# Patient Record
Sex: Female | Born: 1955 | Race: White | Hispanic: No | Marital: Single | Smoking: Never smoker
Health system: Southern US, Community
[De-identification: ages and names within clinical notes are randomized; demographics above are authoritative.]

## PROBLEM LIST (undated history)

## (undated) DIAGNOSIS — E282 Polycystic ovarian syndrome: Secondary | ICD-10-CM

## (undated) DIAGNOSIS — E079 Disorder of thyroid, unspecified: Secondary | ICD-10-CM

## (undated) DIAGNOSIS — K7689 Other specified diseases of liver: Secondary | ICD-10-CM

## (undated) HISTORY — DX: Disorder of thyroid, unspecified: E07.9

## (undated) HISTORY — PX: OTHER SURGICAL HISTORY: SHX169

## (undated) HISTORY — DX: Polycystic ovarian syndrome: E28.2

## (undated) HISTORY — DX: Other specified diseases of liver: K76.89

---

## 2002-02-26 ENCOUNTER — Encounter: Payer: Self-pay | Admitting: Internal Medicine

## 2002-02-26 ENCOUNTER — Ambulatory Visit (HOSPITAL_COMMUNITY): Admission: RE | Admit: 2002-02-26 | Discharge: 2002-02-26 | Payer: Self-pay | Admitting: Cardiology

## 2002-08-15 ENCOUNTER — Encounter: Payer: Self-pay | Admitting: Internal Medicine

## 2002-08-15 ENCOUNTER — Ambulatory Visit (HOSPITAL_COMMUNITY): Admission: RE | Admit: 2002-08-15 | Discharge: 2002-08-15 | Payer: Self-pay | Admitting: Internal Medicine

## 2003-02-10 ENCOUNTER — Ambulatory Visit (HOSPITAL_COMMUNITY): Admission: RE | Admit: 2003-02-10 | Discharge: 2003-02-10 | Payer: Self-pay | Admitting: Internal Medicine

## 2003-02-10 ENCOUNTER — Encounter: Payer: Self-pay | Admitting: Internal Medicine

## 2004-02-13 ENCOUNTER — Ambulatory Visit (HOSPITAL_COMMUNITY): Admission: RE | Admit: 2004-02-13 | Discharge: 2004-02-13 | Payer: Self-pay | Admitting: Internal Medicine

## 2006-02-06 ENCOUNTER — Ambulatory Visit (HOSPITAL_COMMUNITY): Admission: RE | Admit: 2006-02-06 | Discharge: 2006-02-06 | Payer: Self-pay | Admitting: Internal Medicine

## 2008-04-25 ENCOUNTER — Ambulatory Visit: Payer: Self-pay | Admitting: Internal Medicine

## 2008-05-15 ENCOUNTER — Telehealth: Payer: Self-pay | Admitting: Internal Medicine

## 2008-05-16 ENCOUNTER — Ambulatory Visit: Payer: Self-pay | Admitting: Internal Medicine

## 2008-05-16 LAB — HM COLONOSCOPY

## 2008-08-28 ENCOUNTER — Encounter: Payer: Self-pay | Admitting: Internal Medicine

## 2010-05-29 ENCOUNTER — Encounter: Payer: Self-pay | Admitting: Internal Medicine

## 2010-09-02 ENCOUNTER — Encounter: Payer: Self-pay | Admitting: Internal Medicine

## 2010-09-24 NOTE — Letter (Signed)
February 10, 2006     Allena Napoleon, M.D.  [address]  [place], [place] [address]   RE:  Jillian Martinez, Jillian Martinez  MRN:  [id number]  /  DOB:  1955/06/08   Dear Lanora Manis:   I would like to give you follow up on Jillian Martinez's MRI which was done on  February 06, 2006, and was a followup study from October 2005.  As you  remember, she had several liver lesions with one of them focal nodular  hyperplasia in the left lobe of the liver and the other small ones probably  hemangiomas.  I am pleased to report to you that the radiologist feels that  the focal nodular hyperplasia lesion is getting smaller from the original  size of 4-6 cm and has reduced its size to 2.8 x 3.1 cm which could be  possibly due to decreased hormonal influence.  The other two hemangiomas  seem to be the same size.  The study was read by Dr. Jeronimo Greaves and none of  the lesions are really worrisome.   I have talked to Jillian Martinez today on the phone and gave her the report.  We  agreed that the next MRI could be done in 5 years rather than in 2 years,  providing all the parameters stay the same.  I am going to put a reminder  for myself in the computer so we can get a recall letter to her in 5 years  which would be in October 2012.  Please let me know if there are any  pertinent updates on her general health.   Sincerely,     Jillian Morton. Juanda Chance, MD   Cc:  Jillian Martinez, [address] [place], [place] [address].  Job# [id number]   DMB/MedQ  /  Job #:  [id number]  DD:  02/10/2006 / DT:  02/13/2006

## 2011-06-10 LAB — HM PAP SMEAR: HM PAP: NEGATIVE

## 2011-09-02 ENCOUNTER — Other Ambulatory Visit: Payer: Self-pay | Admitting: Family Medicine

## 2011-09-02 DIAGNOSIS — K769 Liver disease, unspecified: Secondary | ICD-10-CM

## 2011-09-07 ENCOUNTER — Ambulatory Visit
Admission: RE | Admit: 2011-09-07 | Discharge: 2011-09-07 | Disposition: A | Discharge status: Home / Self Care | Payer: No Typology Code available for payment source | Source: Ambulatory Visit | Attending: Family Medicine | Admitting: Family Medicine

## 2011-09-07 DIAGNOSIS — K769 Liver disease, unspecified: Secondary | ICD-10-CM

## 2012-06-08 ENCOUNTER — Other Ambulatory Visit: Payer: Self-pay | Admitting: Obstetrics and Gynecology

## 2012-08-27 ENCOUNTER — Ambulatory Visit: Payer: No Typology Code available for payment source | Admitting: Family Medicine

## 2012-08-29 ENCOUNTER — Telehealth: Payer: Self-pay | Admitting: Internal Medicine

## 2012-08-29 DIAGNOSIS — K769 Liver disease, unspecified: Secondary | ICD-10-CM

## 2012-08-29 NOTE — Telephone Encounter (Signed)
The ultrasound 1 year ago failed to visualize the liver lesion, so it was not the best choice of the imaging. Last MRI of the liver was in 2007 and showed the dominant lesion in the left lobe of the liver to decrease in size. Please schedule her for MRI of the left lobe of the liver lesion , compare with 2007 exam.

## 2012-08-29 NOTE — Telephone Encounter (Signed)
Patient states Dr. Sandi Mealy talked with Dr. Juanda Chance last year and she had an ultrasound to follow up on liver lesion. Patient is calling to see if she needs another [place] or MRI to f/u this year. Please, advise.

## 2012-08-30 NOTE — Telephone Encounter (Signed)
There is no record of conversation with Dr Sandi Mealy in the chart and I personally do not remember. But Ultrasound was a reasonable choice for follow up of liver lesions. It just happens that the texture of this lesion  Is not hyperechoic and therefore not seen on sono. It goes with focal nodular hyperplasia, which is a good sign. But from now on , we cannot use ultrasound to follow up on this lesion which seemed to have  been getting smaller anyway.

## 2012-08-30 NOTE — Telephone Encounter (Signed)
Spoke with patient and gave her Dr. Brodie's response. 

## 2012-08-30 NOTE — Telephone Encounter (Signed)
Spoke with patient and she prefers any time except afternoon of Tues or Friday. Spoke with Alinda Money and scheduled at [facility] radiology on 09/04/12 at 8:00 AM. NPO 4 hours prior. Patient notified of appointment. Patient wants to know if Dr. Sandi Mealy talked with Dr. Juanda Chance last year about the ultrasound versus having an MRI? Please, advise.

## 2012-09-04 ENCOUNTER — Ambulatory Visit (HOSPITAL_COMMUNITY)
Admission: RE | Admit: 2012-09-04 | Discharge: 2012-09-04 | Disposition: A | Discharge status: Home / Self Care | Payer: No Typology Code available for payment source | Source: Ambulatory Visit | Attending: Internal Medicine | Admitting: Internal Medicine

## 2012-09-04 DIAGNOSIS — K7689 Other specified diseases of liver: Secondary | ICD-10-CM | POA: Insufficient documentation

## 2012-09-04 DIAGNOSIS — K769 Liver disease, unspecified: Secondary | ICD-10-CM

## 2012-09-04 MED ORDER — GADOXETATE DISODIUM 0.25 MMOL/ML IV SOLN: 5.0000 mL | Freq: Once | INTRAVENOUS | Status: AC | PRN

## 2013-05-23 ENCOUNTER — Other Ambulatory Visit: Payer: Self-pay

## 2013-05-23 DIAGNOSIS — Z1231 Encounter for screening mammogram for malignant neoplasm of breast: Secondary | ICD-10-CM

## 2013-06-28 ENCOUNTER — Ambulatory Visit
Admission: RE | Admit: 2013-06-28 | Discharge: 2013-06-28 | Disposition: A | Discharge status: Home / Self Care | Payer: BC Managed Care – PPO | Source: Ambulatory Visit

## 2013-06-28 DIAGNOSIS — Z1231 Encounter for screening mammogram for malignant neoplasm of breast: Secondary | ICD-10-CM

## 2013-06-28 LAB — HM MAMMOGRAPHY

## 2013-07-04 ENCOUNTER — Ambulatory Visit: Payer: Self-pay | Admitting: Obstetrics and Gynecology

## 2013-07-08 ENCOUNTER — Ambulatory Visit (INDEPENDENT_AMBULATORY_CARE_PROVIDER_SITE_OTHER): Payer: BC Managed Care – PPO | Admitting: Obstetrics and Gynecology

## 2013-07-08 ENCOUNTER — Encounter: Payer: Self-pay | Admitting: Obstetrics and Gynecology

## 2013-07-08 VITALS — BP 140/76 | HR 84 | Ht 62.5 in | Wt 145.0 lb

## 2013-07-08 DIAGNOSIS — Z01419 Encounter for gynecological examination (general) (routine) without abnormal findings: Secondary | ICD-10-CM

## 2013-07-08 DIAGNOSIS — Z0000 Encounter for general adult medical examination without abnormal findings: Secondary | ICD-10-CM

## 2013-07-08 LAB — POCT URINALYSIS DIPSTICK
Bilirubin, UA: NEGATIVE
Glucose, UA: NEGATIVE
Ketones, UA: NEGATIVE
Leukocytes, UA: NEGATIVE
NITRITE UA: NEGATIVE
PROTEIN UA: NEGATIVE
RBC UA: NEGATIVE
UROBILINOGEN UA: NEGATIVE
pH, UA: 5

## 2013-07-08 NOTE — Progress Notes (Signed)
Patient ID: Jillian Martinez, female   DOB: [birth year], 58 y.o.   MRN: [id number] GYNECOLOGY VISIT  PCP:   Shirlean Mylararol Webb, MD  Referring provider:   HPI: 58 y.o.   Single  Caucasian  female   G0P0 with Patient's last menstrual period was 05/09/2012.   here for  AEX.  Patient wants to use a small speculum for her pelvic exam as she has difficulty. A few warm spells for a month, and this stopped.   Taking progesterone cream once a day for three weeks and then one week off.  Sees Jillian NoseJill Martinez in [place] for naturopathic care and hormonal management.   Had MRI to document normalization of liver changes - had history of focal nodular hyperplasia.  Hgb:    PCP Urine:  neg  GYNECOLOGIC HISTORY: Patient's last menstrual period was 05/09/2012. Sexually active:  no Partner preference: female Contraception:  Postmenopausal  Menopausal hormone therapy: no DES exposure:   no Blood transfusions:   no Sexually transmitted diseases:   no GYN procedures and prior surgeries:  no Last mammogram: 06-28-13 wnl:[facility]                Last pap and high risk HPV testing:   06-10-11 wnl:neg HR HPV History of abnormal pap smear:  no   OB History   Grav Para Term Preterm Abortions TAB SAB Ect Mult Living   0                LIFESTYLE: Exercise:   no            Tobacco: no Alcohol:    no Drug use:  no  OTHER HEALTH MAINTENANCE: Tetanus/TDap:  2009 Gardisil:             n/a Influenza:           never Zostavax:           n/a  Bone density:    n/a Colonoscopy:    05-16-08 with Dr. Lina Sarora Martinez which revealed melanosis of colon, otherwise normal. Next colonoscopy due 05/2018.  Cholesterol check: 06/2013 wnl with PCP  Family History  Problem Relation Age of Onset  . Hypertension Mother   . Thyroid disease Mother   . Hypertension Father   . Heart disease Father   . Skin cancer Father     There are no active problems to display for this patient.  Past Medical History  Diagnosis Date  .  Focal nodular hyperplasia of liver   . Thyroid disease     hypothyroidism  . PCOS (polycystic ovarian syndrome)     Past Surgical History  Procedure Laterality Date  . Gartners duct cyst Right     -vaginal wall    ALLERGIES: Neomycin-bacitracin zn-polymyx  Current Outpatient Prescriptions  Medication Sig Dispense Refill  . levothyroxine (SYNTHROID, LEVOTHROID) 75 MCG tablet Take 75 mcg by mouth daily before breakfast. Take 1/2 tablet 7 days weekly      . tobramycin-dexamethasone (TOBRADEX) ophthalmic solution Place 1 drop into both eyes as needed.       No current facility-administered medications for this visit.     ROS:  Pertinent items are noted in HPI.  SOCIAL HISTORY:  Single.  Biomedical engineerroperty manager and musician.   PHYSICAL EXAMINATION:    BP 140/76  Pulse 84  Ht 5' 2.5" (1.588 m)  Wt 145 lb (65.772 kg)  BMI 26.08 kg/m2  LMP 05/09/2012   Wt Readings from Last 3 Encounters:  07/08/13 145  lb (65.772 kg)     Ht Readings from Last 3 Encounters:  07/08/13 5' 2.5" (1.588 m)    General appearance: alert, cooperative and appears stated age Head: Normocephalic, without obvious abnormality, atraumatic Neck: no adenopathy, supple, symmetrical, trachea midline and thyroid not enlarged, symmetric, no tenderness/mass/nodules Lungs: clear to auscultation bilaterally Breasts: Inspection negative, No nipple retraction or dimpling, No nipple discharge or bleeding, No axillary or supraclavicular adenopathy, Normal to palpation without dominant masses Heart: regular rate and rhythm Abdomen: soft, non-tender; no masses,  no organomegaly Extremities: extremities normal, atraumatic, no cyanosis or edema Skin: Skin color, texture, turgor normal. No rashes or lesions Lymph nodes: Cervical, supraclavicular, and axillary nodes normal. No abnormal inguinal nodes palpated Neurologic: Grossly normal  Thin Pederson speculum used. Pelvic: External genitalia:  no lesions              Urethra:   normal appearing urethra with no masses, tenderness or lesions              Bartholins and Skenes: normal                 Vagina:  2 cm right vaginal wall cyst.              Cervix: unable to see due to patient intolerance of speculum.               Pap and high risk HPV testing done: no.            Bimanual Exam:  Uterus:  uterus is normal size, shape, consistency and nontender                                      Adnexa: normal adnexa in size, nontender and no masses                                      Rectovaginal: Confirms                                      Anus:  normal sphincter tone, no lesions  ASSESSMENT  Normal gynecologic exam. Right vaginal wall cyst, stable. Intolerance of speculum exams. Postmenopausal female.   Taking natural progesterone.   PLAN  Mammogram recommended yearly.  Pap smear and high risk HPV testing in 2018.  If has difficulty in future with exams, would consider Xanax or Valium to be able to assist with completion of the pelvic exam.  We discussed Gartner duct cysts.  I do not recommend excision of this benign, stable cyst.  Counseled on self breast exam, Calcium and vitamin D intake, exercise. Hormonal management through Jillian Martinez.  Return annually or prn   An After Visit Summary was printed and given to the patient.

## 2013-07-08 NOTE — Patient Instructions (Signed)

## 2013-07-10 ENCOUNTER — Ambulatory Visit: Payer: Self-pay | Admitting: Obstetrics and Gynecology

## 2014-02-17 ENCOUNTER — Telehealth: Payer: Self-pay | Admitting: Obstetrics and Gynecology

## 2014-02-17 NOTE — Telephone Encounter (Signed)
lmtcb about canceled appointment with Dr. Edward JollySilva

## 2014-06-05 HISTORY — PX: ROOT CANAL: SHX2363

## 2014-06-10 ENCOUNTER — Other Ambulatory Visit: Payer: Self-pay

## 2014-06-10 DIAGNOSIS — Z1231 Encounter for screening mammogram for malignant neoplasm of breast: Secondary | ICD-10-CM

## 2014-06-30 ENCOUNTER — Encounter (INDEPENDENT_AMBULATORY_CARE_PROVIDER_SITE_OTHER): Payer: Self-pay

## 2014-06-30 ENCOUNTER — Ambulatory Visit
Admission: RE | Admit: 2014-06-30 | Discharge: 2014-06-30 | Disposition: A | Discharge status: Home / Self Care | Payer: BLUE CROSS/BLUE SHIELD | Source: Ambulatory Visit

## 2014-06-30 DIAGNOSIS — Z1231 Encounter for screening mammogram for malignant neoplasm of breast: Secondary | ICD-10-CM

## 2014-07-10 ENCOUNTER — Encounter: Payer: Self-pay | Admitting: Obstetrics and Gynecology

## 2014-07-10 ENCOUNTER — Ambulatory Visit (INDEPENDENT_AMBULATORY_CARE_PROVIDER_SITE_OTHER): Payer: BLUE CROSS/BLUE SHIELD | Admitting: Obstetrics and Gynecology

## 2014-07-10 ENCOUNTER — Ambulatory Visit: Payer: Self-pay | Admitting: Obstetrics and Gynecology

## 2014-07-10 VITALS — BP 144/90 | HR 92 | Ht 62.5 in | Wt 145.2 lb

## 2014-07-10 DIAGNOSIS — Z01419 Encounter for gynecological examination (general) (routine) without abnormal findings: Secondary | ICD-10-CM | POA: Diagnosis not present

## 2014-07-10 DIAGNOSIS — Z0000 Encounter for general adult medical examination without abnormal findings: Secondary | ICD-10-CM | POA: Diagnosis not present

## 2014-07-10 DIAGNOSIS — N898 Other specified noninflammatory disorders of vagina: Secondary | ICD-10-CM

## 2014-07-10 DIAGNOSIS — N952 Postmenopausal atrophic vaginitis: Secondary | ICD-10-CM

## 2014-07-10 DIAGNOSIS — R312 Other microscopic hematuria: Secondary | ICD-10-CM | POA: Diagnosis not present

## 2014-07-10 DIAGNOSIS — R3129 Other microscopic hematuria: Secondary | ICD-10-CM

## 2014-07-10 NOTE — Patient Instructions (Signed)
EXERCISE AND DIET:  We recommended that you start or continue a regular exercise program for good health. Regular exercise means any activity that makes your heart beat faster and makes you sweat.  We recommend exercising at least 30 minutes per day at least 3 days a week, preferably 4 or 5.  We also recommend a diet low in fat and sugar.  Inactivity, poor dietary choices and obesity can cause diabetes, heart attack, stroke, and kidney damage, among others.    ALCOHOL AND SMOKING:  Women should limit their alcohol intake to no more than 7 drinks/beers/glasses of wine (combined, not each!) per week. Moderation of alcohol intake to this level decreases your risk of breast cancer and liver damage. And of course, no recreational drugs are part of a healthy lifestyle.  And absolutely no smoking or even second hand smoke. Most people know smoking can cause heart and lung diseases, but did you know it also contributes to weakening of your bones? Aging of your skin?  Yellowing of your teeth and nails?  CALCIUM AND VITAMIN D:  Adequate intake of calcium and Vitamin D are recommended.  The recommendations for exact amounts of these supplements seem to change often, but generally speaking 600 mg of calcium (either carbonate or citrate) and 800 units of Vitamin D per day seems prudent. Certain women may benefit from higher intake of Vitamin D.  If you are among these women, your doctor will have told you during your visit.    PAP SMEARS:  Pap smears, to check for cervical cancer or precancers,  have traditionally been done yearly, although recent scientific advances have shown that most women can have pap smears less often.  However, every woman still should have a physical exam from her gynecologist every year. It will include a breast check, inspection of the vulva and vagina to check for abnormal growths or skin changes, a visual exam of the cervix, and then an exam to evaluate the size and shape of the uterus and  ovaries.  And after 57 years of age, a rectal exam is indicated to check for rectal cancers. We will also provide age appropriate advice regarding health maintenance, like when you should have certain vaccines, screening for sexually transmitted diseases, bone density testing, colonoscopy, mammograms, etc.   MAMMOGRAMS:  All women over 40 years old should have a yearly mammogram. Many facilities now offer a "3D" mammogram, which may cost around $50 extra out of pocket. If possible,  we recommend you accept the option to have the 3D mammogram performed.  It both reduces the number of women who will be called back for extra views which then turn out to be normal, and it is better than the routine mammogram at detecting truly abnormal areas.    COLONOSCOPY:  Colonoscopy to screen for colon cancer is recommended for all women at age 50.  We know, you hate the idea of the prep.  We agree, BUT, having colon cancer and not knowing it is worse!!  Colon cancer so often starts as a polyp that can be seen and removed at colonscopy, which can quite literally save your life!  And if your first colonoscopy is normal and you have no family history of colon cancer, most women don't have to have it again for 10 years.  Once every ten years, you can do something that may end up saving your life, right?  We will be happy to help you get it scheduled when you are ready.    Be sure to check your insurance coverage so you understand how much it will cost.  It may be covered as a preventative service at no cost, but you should check your particular policy.     Atrophic Vaginitis Atrophic vaginitis is a problem of low levels of estrogen in women. This problem can happen at any age. It is most common in women who have gone through menopause ("the change").  HOW WILL I KNOW IF I HAVE THIS PROBLEM? You may have:  Trouble with peeing (urinating), such as:  Going to the bathroom often.  A hard time holding your pee until you reach  a bathroom.  Leaking pee.  Having pain when you pee.  Itching or a burning feeling.  Vaginal bleeding and spotting.  Pain during sex.  Dryness of the vagina.  A yellow, bad-smelling fluid (discharge) coming from the vagina. HOW WILL MY DOCTOR CHECK FOR THIS PROBLEM?  During your exam, your doctor will likely find the problem.  If there is a vaginal fluid, it may be checked for infection. HOW WILL THIS PROBLEM BE TREATED? Keep the vulvar skin as clean as possible. Moisturizers and lubricants can help with some of the symptoms. Estrogen replacement can help. There are 2 ways to take estrogen:  Systemic estrogen gets estrogen to your whole body. It takes many weeks or months before the symptoms get better.  You take an estrogen pill.  You use a skin patch. This is a patch that you put on your skin.  If you still have your uterus, your doctor may ask you to take a hormone. Talk to your doctor about the right medicine for you.  Estrogen cream.  This puts estrogen only at the part of your body where you apply it. The cream is put into the vagina or put on the vulvar skin. For some women, estrogen cream works faster than pills or the patch. CAN ALL WOMEN WITH THIS PROBLEM USE ESTROGEN? No. Women with certain types of cancer, liver problems, or problems with blood clots should not take estrogen. Your doctor can help you decide the best treatment for your symptoms. Document Released: 10/12/2007 Document Revised: 04/30/2013 Document Reviewed: 10/12/2007 ExitCare Patient Information 2015 [employer]. This information is not intended to replace advice given to you by your health care provider. Make sure you discuss any questions you have with your health care provider.  
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
## 2015-06-03 ENCOUNTER — Other Ambulatory Visit: Payer: Self-pay

## 2015-06-03 DIAGNOSIS — Z1231 Encounter for screening mammogram for malignant neoplasm of breast: Secondary | ICD-10-CM

## 2015-07-02 ENCOUNTER — Ambulatory Visit
Admission: RE | Admit: 2015-07-02 | Discharge: 2015-07-02 | Disposition: A | Discharge status: Home / Self Care | Payer: BLUE CROSS/BLUE SHIELD | Source: Ambulatory Visit

## 2015-07-02 DIAGNOSIS — Z1231 Encounter for screening mammogram for malignant neoplasm of breast: Secondary | ICD-10-CM

## 2015-07-15 ENCOUNTER — Ambulatory Visit (INDEPENDENT_AMBULATORY_CARE_PROVIDER_SITE_OTHER): Payer: BLUE CROSS/BLUE SHIELD | Admitting: Obstetrics and Gynecology

## 2015-07-15 ENCOUNTER — Encounter: Payer: Self-pay | Admitting: Obstetrics and Gynecology

## 2015-07-15 VITALS — BP 140/82 | HR 70 | Resp 14 | Ht 62.5 in | Wt 149.6 lb

## 2015-07-15 DIAGNOSIS — Z01419 Encounter for gynecological examination (general) (routine) without abnormal findings: Secondary | ICD-10-CM | POA: Diagnosis not present

## 2015-07-15 NOTE — Patient Instructions (Signed)

## 2015-07-15 NOTE — Progress Notes (Signed)
Patient ID: Jillian ElliotCarol A Johannsen, female   DOB: March 25, 1956, 60 y.o.   MRN: [id number] 60 y.o. G0P0 Single Caucasian female here for annual exam.    Has 2 cm right vaginal wall cyst.   Using progesterone cream and adrena support cream.   Brings in labs today.  Cholesterol/HDL - 2.6.  Plays a harp.   PCP:  Shirlean Mylararol Webb, MD  Patient's last menstrual period was 05/09/2012.          Sexually active: No. female The current method of family planning is post menopausal status.    Exercising: No.  Does Pilates.   Smoker:  no  Health Maintenance: Pap:  07-19-11 Neg:Neg HR HPV History of abnormal Pap:  no MMG:  07-03-15 Density Cat.B/Neg/BiRads1:[facility] Colonoscopy:  05/2008 normal with Dr.Brodie;next due 05/2018. BMD:   n/a  Result  n/a TDaP:  05-11-07 Screening Labs:  Hb today: PCP, Urine today:  ---   reports that she has never smoked. She does not have any smokeless tobacco history on file. She reports that she does not drink alcohol or use illicit drugs.  Past Medical History  Diagnosis Date  . Focal nodular hyperplasia of liver   . Thyroid disease     hypothyroidism  . PCOS (polycystic ovarian syndrome)     Past Surgical History  Procedure Laterality Date  . Gartners duct cyst Right     -vaginal wall  . Root canal  06/05/14    Tooth Capped     Current Outpatient Prescriptions  Medication Sig Dispense Refill  . levothyroxine (SYNTHROID, LEVOTHROID) 75 MCG tablet Take 75 mcg by mouth daily before breakfast. Take 1/2 tablet 7 days weekly     No current facility-administered medications for this visit.    Family History  Problem Relation Age of Onset  . Hypertension Mother   . Thyroid disease Mother   . Hypertension Father   . Heart disease Father   . Skin cancer Father     ROS:  Pertinent items are noted in HPI.  Otherwise, a comprehensive ROS was negative.  Exam:   BP 140/82 mmHg  Pulse 70  Resp 14  Ht 5' 2.5" (1.588 m)  Wt 149 lb 9.6 oz (67.858 kg)  BMI 26.91  kg/m2  LMP 05/09/2012    General appearance: alert, cooperative and appears stated age Head: Normocephalic, without obvious abnormality, atraumatic Neck: no adenopathy, supple, symmetrical, trachea midline and thyroid normal to inspection and palpation Lungs: clear to auscultation bilaterally Breasts: normal appearance, no masses or tenderness, Inspection negative, No nipple retraction or dimpling, No nipple discharge or bleeding, No axillary or supraclavicular adenopathy Heart: regular rate and rhythm Abdomen: soft, non-tender; bowel sounds normal; no masses,  no organomegaly Extremities: extremities normal, atraumatic, no cyanosis or edema Skin: Skin color, texture, turgor normal. No rashes or lesions Lymph nodes: Cervical, supraclavicular, and axillary nodes normal. No abnormal inguinal nodes palpated Neurologic: Grossly normal  Pelvic: External genitalia:  no lesions              Urethra:  normal appearing urethra with no masses, tenderness or lesions              Bartholins and Skenes: normal                 Vagina: normal appearing vagina with normal color and discharge, no lesions              Cervix: not able to vistualize due to patient discomfort.  Blind  pap done with cytobrush.              Pap taken: Yes.   Bimanual Exam:  Uterus:  normal size, contour, position, consistency, mobility, non-tender              Adnexa: normal adnexa and no mass, fullness, tenderness              Rectovaginal: Yes.  .  Confirms.              Anus:  normal sphincter tone, no lesions  Chaperone was present for exam.  Assessment:   Well woman visit with normal exam. Painful pelvic exams. Using progesterone cream.  Stable right vaginal wall cyst.  Liver focal nodular hyperplasia. Resolved.   Plan: Yearly mammogram recommended after age 16.  Recommended self breast exam.  Pap and HR HPV as above. Discussed Calcium, Vitamin D, regular exercise program including cardiovascular and weight  bearing exercise. Labs performed.  No..    Refills given on medications.  No..    Follow up annually and prn.     After visit summary provided.

## 2015-07-17 ENCOUNTER — Ambulatory Visit: Payer: BLUE CROSS/BLUE SHIELD | Admitting: Obstetrics and Gynecology

## 2015-07-19 LAB — IPS PAP TEST WITH HPV

## 2016-02-24 DIAGNOSIS — D225 Melanocytic nevi of trunk: Secondary | ICD-10-CM | POA: Diagnosis not present

## 2016-02-24 DIAGNOSIS — D2262 Melanocytic nevi of left upper limb, including shoulder: Secondary | ICD-10-CM | POA: Diagnosis not present

## 2016-02-24 DIAGNOSIS — D2261 Melanocytic nevi of right upper limb, including shoulder: Secondary | ICD-10-CM | POA: Diagnosis not present

## 2016-02-24 DIAGNOSIS — D2239 Melanocytic nevi of other parts of face: Secondary | ICD-10-CM | POA: Diagnosis not present

## 2016-02-24 DIAGNOSIS — L918 Other hypertrophic disorders of the skin: Secondary | ICD-10-CM | POA: Diagnosis not present

## 2016-06-07 ENCOUNTER — Other Ambulatory Visit: Payer: Self-pay | Admitting: Family Medicine

## 2016-06-07 DIAGNOSIS — Z1231 Encounter for screening mammogram for malignant neoplasm of breast: Secondary | ICD-10-CM

## 2016-07-04 ENCOUNTER — Ambulatory Visit
Admission: RE | Admit: 2016-07-04 | Discharge: 2016-07-04 | Disposition: A | Discharge status: Home / Self Care | Payer: BLUE CROSS/BLUE SHIELD | Source: Ambulatory Visit | Attending: Family Medicine | Admitting: Family Medicine

## 2016-07-04 DIAGNOSIS — Z1231 Encounter for screening mammogram for malignant neoplasm of breast: Secondary | ICD-10-CM

## 2016-07-14 NOTE — Progress Notes (Signed)
61 y.o. G0P0 Single Caucasian female here for annual exam.    Sees PCP this am.   States she tested positive for Hep B aby.  No prior vaccination.  This testing was done through Dr. Alessandra Bevels.   Still taking progesterone cream - Georganna Skeans, [facility].   Sees Dr. Nicholas Lose for dermatology visits every other year.   PCP:   Dr. Shirlean Mylar, [facility]  Patient's last menstrual period was 05/09/2012.           Sexually active: No.  The current method of family planning is post menopausal status.    Exercising: Yes.    walking Smoker:  no  Health Maintenance: Pap:  07-15-15 Neg:Neg HR HPV;06-10-11 Neg:Neg HR HPV History of abnormal Pap:  no MMG: 07-04-16 Density B/Neg/BiRads1:[facility]  Colonoscopy:  05/2008 normal with Dr.Brodie;next due 05/2018.  BMD:   n/a  Result  n/a TDaP:  05-11-07 Gardasil:   N/A HIV: discuss today Hep C:  Thinks she may have done this already. Screening Labs:  Hb today: PCP   reports that she has never smoked. She has never used smokeless tobacco. She reports that she does not drink alcohol or use drugs.  Past Medical History:  Diagnosis Date  . Focal nodular hyperplasia of liver   . PCOS (polycystic ovarian syndrome)   . Thyroid disease    hypothyroidism    Past Surgical History:  Procedure Laterality Date  . gartners duct cyst Right    -vaginal wall  . ROOT CANAL  06/05/14   Tooth Capped     Current Outpatient Prescriptions  Medication Sig Dispense Refill  . levothyroxine (SYNTHROID, LEVOTHROID) 75 MCG tablet Take 75 mcg by mouth daily before breakfast. Take 1/2 tablet 7 days weekly     No current facility-administered medications for this visit.     Family History  Problem Relation Age of Onset  . Hypertension Mother   . Thyroid disease Mother   . Hypertension Father   . Heart disease Father   . Skin cancer Father     ROS:  Pertinent items are noted in HPI.  Otherwise, a comprehensive ROS was negative.  Exam:   BP 128/70 (BP Location:  Right Arm, Patient Position: Sitting, Cuff Size: Normal)   Pulse 68   Resp 16   Ht 5' 2.5" (1.588 m)   Wt 152 lb (68.9 kg)   LMP 05/09/2012   BMI 27.36 kg/m     General appearance: alert, cooperative and appears stated age Head: Normocephalic, without obvious abnormality, atraumatic Neck: no adenopathy, supple, symmetrical, trachea midline and thyroid normal to inspection and palpation Lungs: clear to auscultation bilaterally Breasts: normal appearance, no masses or tenderness, No nipple retraction or dimpling, No nipple discharge or bleeding, No axillary or supraclavicular adenopathy Heart: regular rate and rhythm Abdomen: soft, non-tender; no masses, no organomegaly Extremities: extremities normal, atraumatic, no cyanosis or edema Skin: Skin color, texture, turgor normal. No rashes or lesions Lymph nodes: Cervical, supraclavicular, and axillary nodes normal. No abnormal inguinal nodes palpated Neurologic: Grossly normal  Pelvic: External genitalia:  no lesions              Urethra:  normal appearing urethra with no masses, tenderness or lesions              Bartholins and Skenes: normal                 Vagina: normal appearing vagina with normal color and discharge, no lesions.  Adolescent  speculum used.                           Cervix: no lesions              Pap taken:  No. Bimanual Exam:  Uterus:  normal size, contour, position, consistency, mobility, non-tender.  1 cm left vaginal apical cyst, nontender.              Adnexa: no mass, fullness, tenderness              Rectal exam: Yes.  .  Confirms.              Anus:  normal sphincter tone, no lesions  Chaperone was present for exam.  Assessment:   Well woman visit with normal exam. Painful pelvic exams. Using progesterone cream.  Stable patient left-sided vaginal wall cyst. (I previously documented this as right, but it is the patient's left side.) Liver focal nodular hyperplasia. Resolved. Hypothyroidism.    Plan: Mammogram screening discussed.  She will schedule. Recommended self breast awareness. Pap and HR HPV as above. Guidelines for Calcium, Vitamin D, regular exercise program including cardiovascular and weight bearing exercise. Labs with PCP.  Follow up annually and prn.   After visit summary provided.

## 2016-07-15 ENCOUNTER — Ambulatory Visit (INDEPENDENT_AMBULATORY_CARE_PROVIDER_SITE_OTHER): Payer: BLUE CROSS/BLUE SHIELD | Admitting: Obstetrics and Gynecology

## 2016-07-15 ENCOUNTER — Encounter: Payer: Self-pay | Admitting: Obstetrics and Gynecology

## 2016-07-15 VITALS — BP 128/70 | HR 68 | Resp 16 | Ht 62.5 in | Wt 152.0 lb

## 2016-07-15 DIAGNOSIS — Z0000 Encounter for general adult medical examination without abnormal findings: Secondary | ICD-10-CM | POA: Diagnosis not present

## 2016-07-15 DIAGNOSIS — Z131 Encounter for screening for diabetes mellitus: Secondary | ICD-10-CM | POA: Diagnosis not present

## 2016-07-15 DIAGNOSIS — Z01419 Encounter for gynecological examination (general) (routine) without abnormal findings: Secondary | ICD-10-CM

## 2016-07-15 DIAGNOSIS — E039 Hypothyroidism, unspecified: Secondary | ICD-10-CM | POA: Diagnosis not present

## 2016-07-15 DIAGNOSIS — E785 Hyperlipidemia, unspecified: Secondary | ICD-10-CM | POA: Diagnosis not present

## 2016-07-15 DIAGNOSIS — Z113 Encounter for screening for infections with a predominantly sexual mode of transmission: Secondary | ICD-10-CM | POA: Diagnosis not present

## 2016-07-27 ENCOUNTER — Ambulatory Visit: Payer: BLUE CROSS/BLUE SHIELD | Admitting: Obstetrics and Gynecology

## 2016-08-29 DIAGNOSIS — Z5181 Encounter for therapeutic drug level monitoring: Secondary | ICD-10-CM | POA: Diagnosis not present

## 2016-08-29 DIAGNOSIS — E039 Hypothyroidism, unspecified: Secondary | ICD-10-CM | POA: Diagnosis not present

## 2017-04-20 DIAGNOSIS — D2262 Melanocytic nevi of left upper limb, including shoulder: Secondary | ICD-10-CM | POA: Diagnosis not present

## 2017-04-20 DIAGNOSIS — D2261 Melanocytic nevi of right upper limb, including shoulder: Secondary | ICD-10-CM | POA: Diagnosis not present

## 2017-04-20 DIAGNOSIS — D224 Melanocytic nevi of scalp and neck: Secondary | ICD-10-CM | POA: Diagnosis not present

## 2017-04-20 DIAGNOSIS — D1801 Hemangioma of skin and subcutaneous tissue: Secondary | ICD-10-CM | POA: Diagnosis not present

## 2017-04-20 DIAGNOSIS — L905 Scar conditions and fibrosis of skin: Secondary | ICD-10-CM | POA: Diagnosis not present

## 2017-05-25 ENCOUNTER — Other Ambulatory Visit: Payer: Self-pay | Admitting: Obstetrics and Gynecology

## 2017-05-25 DIAGNOSIS — Z1231 Encounter for screening mammogram for malignant neoplasm of breast: Secondary | ICD-10-CM

## 2017-07-05 ENCOUNTER — Ambulatory Visit
Admission: RE | Admit: 2017-07-05 | Discharge: 2017-07-05 | Disposition: A | Discharge status: Home / Self Care | Payer: BLUE CROSS/BLUE SHIELD | Source: Ambulatory Visit | Attending: Obstetrics and Gynecology | Admitting: Obstetrics and Gynecology

## 2017-07-05 DIAGNOSIS — Z1231 Encounter for screening mammogram for malignant neoplasm of breast: Secondary | ICD-10-CM | POA: Diagnosis not present

## 2017-07-20 ENCOUNTER — Ambulatory Visit (INDEPENDENT_AMBULATORY_CARE_PROVIDER_SITE_OTHER): Payer: BLUE CROSS/BLUE SHIELD | Admitting: Obstetrics and Gynecology

## 2017-07-20 ENCOUNTER — Encounter: Payer: Self-pay | Admitting: Obstetrics and Gynecology

## 2017-07-20 ENCOUNTER — Other Ambulatory Visit: Payer: Self-pay

## 2017-07-20 VITALS — BP 136/78 | HR 72 | Resp 16 | Ht 62.0 in | Wt 152.0 lb

## 2017-07-20 DIAGNOSIS — Z01419 Encounter for gynecological examination (general) (routine) without abnormal findings: Secondary | ICD-10-CM

## 2017-07-20 DIAGNOSIS — Z780 Asymptomatic menopausal state: Secondary | ICD-10-CM | POA: Diagnosis not present

## 2017-07-20 DIAGNOSIS — Z23 Encounter for immunization: Secondary | ICD-10-CM | POA: Diagnosis not present

## 2017-07-20 NOTE — Progress Notes (Signed)
62 y.o. G0P0 Single Caucasian female here for annual exam.    Notes blood in the stool occasionally with straining.   Still using progesterone cream 3 weeks on and 1 week off, Jillian SkeansJill Martinez. Does saliva testing for levels.   Labs with PCP tomorrow plus office visit.   PCP: Shirlean Mylararol Webb, MD  Patient's last menstrual period was 05/09/2012.           Sexually active: No.  The current method of family planning is post menopausal status.    Exercising: No.  The patient does not participate in regular exercise at present. Smoker:  no  Health Maintenance: Pap:  07-15-15 Neg:Neg HR HPV History of abnormal Pap:  no MMG:  07/05/17 BIRADS 1 negative/density b Colonoscopy:  05/2008 normal with Dr.Brodie;next due 05/2018 BMD:   n/a  Result  n/a TDaP: 05/11/07 Gardasil:   n/a HIV and Hep C: 07/15/2016 per patient negative at PCP Screening Labs:  PCP   reports that  has never smoked. she has never used smokeless tobacco. She reports that she does not drink alcohol or use drugs.  Past Medical History:  Diagnosis Date  . Focal nodular hyperplasia of liver   . PCOS (polycystic ovarian syndrome)   . Thyroid disease    hypothyroidism    Past Surgical History:  Procedure Laterality Date  . gartners duct cyst Right    -vaginal wall  . ROOT CANAL  06/05/14   Tooth Capped     Current Outpatient Medications  Medication Sig Dispense Refill  . levothyroxine (SYNTHROID, LEVOTHROID) 50 MCG tablet Take 50 mcg by mouth daily before breakfast. Take 1/2 tablet 7 days weekly     No current facility-administered medications for this visit.     Family History  Problem Relation Age of Onset  . Hypertension Mother   . Thyroid disease Mother   . Hypertension Father   . Heart disease Father   . Skin cancer Father     ROS:  Pertinent items are noted in HPI.  Otherwise, a comprehensive ROS was negative.  Exam:   BP (!) 162/80 (BP Location: Right Arm, Patient Position: Sitting, Cuff Size: Normal)    Pulse 72   Resp 16   Ht 5\' 2"  (1.575 m)   Wt 152 lb (68.9 kg)   LMP 05/09/2012   BMI 27.80 kg/m     General appearance: alert, cooperative and appears stated age Head: Normocephalic, without obvious abnormality, atraumatic Neck: no adenopathy, supple, symmetrical, trachea midline and thyroid normal to inspection and palpation Lungs: clear to auscultation bilaterally Breasts: normal appearance, no masses or tenderness, No nipple retraction or dimpling, No nipple discharge or bleeding, No axillary or supraclavicular adenopathy Heart: regular rate and rhythm Abdomen: soft, non-tender; no masses, no organomegaly Extremities: extremities normal, atraumatic, no cyanosis or edema Skin: Skin color, texture, turgor normal. No rashes or lesions Lymph nodes: Cervical, supraclavicular, and axillary nodes normal. No abnormal inguinal nodes palpated Neurologic: Grossly normal  Pelvic: External genitalia:  no lesions              Urethra:  normal appearing urethra with no masses, tenderness or lesions              Bartholins and Skenes: normal                 Vagina: normal appearing vagina with normal color and discharge, no lesions              Cervix: no lesions  Pap taken: No. Bimanual Exam:  Uterus:  normal size, contour, position, consistency, mobility, non-tender              Adnexa: no mass, fullness, tenderness              Rectal exam: Yes.  .  Confirms.              Anus:  normal sphincter tone,  Small external hemorrhoid.  Chaperone was present for exam.  Assessment:   Well woman visit with normal exam. Menopausal female. Difficulty with pelvic exams.  Left vaginal wall cyst.  Not noted today. Hx liver focal nodular hyperplasia.  Hypothyroidism.  Symptomatic hemorrhoid occasionally.   Plan: Mammogram screening discussed. Recommended self breast awareness. Pap and HR HPV as above. Guidelines for Calcium, Vitamin D, regular exercise program including  cardiovascular and weight bearing exercise. Colonoscopy next year.  TDap.  BMD. Labs with PCP. Follow up annually and prn.   After visit summary provided.

## 2017-07-21 DIAGNOSIS — Z5181 Encounter for therapeutic drug level monitoring: Secondary | ICD-10-CM | POA: Diagnosis not present

## 2017-07-21 DIAGNOSIS — E785 Hyperlipidemia, unspecified: Secondary | ICD-10-CM | POA: Diagnosis not present

## 2017-07-21 DIAGNOSIS — E039 Hypothyroidism, unspecified: Secondary | ICD-10-CM | POA: Diagnosis not present

## 2017-07-21 DIAGNOSIS — Z0000 Encounter for general adult medical examination without abnormal findings: Secondary | ICD-10-CM | POA: Diagnosis not present

## 2018-03-16 DIAGNOSIS — H43812 Vitreous degeneration, left eye: Secondary | ICD-10-CM | POA: Diagnosis not present

## 2018-04-16 DIAGNOSIS — H43392 Other vitreous opacities, left eye: Secondary | ICD-10-CM | POA: Diagnosis not present

## 2018-04-16 DIAGNOSIS — H43813 Vitreous degeneration, bilateral: Secondary | ICD-10-CM | POA: Diagnosis not present

## 2018-04-16 DIAGNOSIS — H35423 Microcystoid degeneration of retina, bilateral: Secondary | ICD-10-CM | POA: Diagnosis not present

## 2018-05-09 DIAGNOSIS — M8580 Other specified disorders of bone density and structure, unspecified site: Secondary | ICD-10-CM

## 2018-05-09 HISTORY — DX: Other specified disorders of bone density and structure, unspecified site: M85.80

## 2018-05-22 ENCOUNTER — Other Ambulatory Visit: Payer: Self-pay | Admitting: Obstetrics and Gynecology

## 2018-05-22 DIAGNOSIS — Z1231 Encounter for screening mammogram for malignant neoplasm of breast: Secondary | ICD-10-CM

## 2018-06-07 DIAGNOSIS — L720 Epidermal cyst: Secondary | ICD-10-CM | POA: Diagnosis not present

## 2018-06-07 DIAGNOSIS — D2262 Melanocytic nevi of left upper limb, including shoulder: Secondary | ICD-10-CM | POA: Diagnosis not present

## 2018-06-07 DIAGNOSIS — D2261 Melanocytic nevi of right upper limb, including shoulder: Secondary | ICD-10-CM | POA: Diagnosis not present

## 2018-06-07 DIAGNOSIS — D2239 Melanocytic nevi of other parts of face: Secondary | ICD-10-CM | POA: Diagnosis not present

## 2018-06-14 ENCOUNTER — Encounter: Payer: Self-pay | Admitting: Gastroenterology

## 2018-07-06 ENCOUNTER — Ambulatory Visit
Admission: RE | Admit: 2018-07-06 | Discharge: 2018-07-06 | Disposition: A | Discharge status: Home / Self Care | Payer: BLUE CROSS/BLUE SHIELD | Source: Ambulatory Visit | Attending: Obstetrics and Gynecology | Admitting: Obstetrics and Gynecology

## 2018-07-06 DIAGNOSIS — Z1231 Encounter for screening mammogram for malignant neoplasm of breast: Secondary | ICD-10-CM

## 2018-07-16 ENCOUNTER — Other Ambulatory Visit: Payer: BLUE CROSS/BLUE SHIELD

## 2018-07-16 ENCOUNTER — Ambulatory Visit: Payer: BLUE CROSS/BLUE SHIELD

## 2018-07-20 ENCOUNTER — Other Ambulatory Visit: Payer: BLUE CROSS/BLUE SHIELD

## 2018-07-23 ENCOUNTER — Ambulatory Visit: Payer: BLUE CROSS/BLUE SHIELD | Admitting: Obstetrics and Gynecology

## 2018-07-24 ENCOUNTER — Other Ambulatory Visit: Payer: Self-pay

## 2018-07-24 ENCOUNTER — Ambulatory Visit (INDEPENDENT_AMBULATORY_CARE_PROVIDER_SITE_OTHER): Payer: BLUE CROSS/BLUE SHIELD | Admitting: Obstetrics and Gynecology

## 2018-07-24 ENCOUNTER — Other Ambulatory Visit (HOSPITAL_COMMUNITY)
Admission: RE | Admit: 2018-07-24 | Discharge: 2018-07-24 | Disposition: A | Discharge status: Home / Self Care | Payer: BLUE CROSS/BLUE SHIELD | Source: Ambulatory Visit | Attending: Obstetrics and Gynecology | Admitting: Obstetrics and Gynecology

## 2018-07-24 ENCOUNTER — Encounter: Payer: Self-pay | Admitting: Obstetrics and Gynecology

## 2018-07-24 VITALS — BP 144/80 | HR 72 | Resp 16 | Ht 62.5 in | Wt 155.8 lb

## 2018-07-24 DIAGNOSIS — Z01419 Encounter for gynecological examination (general) (routine) without abnormal findings: Secondary | ICD-10-CM

## 2018-07-24 NOTE — Progress Notes (Signed)
63 y.o. G0P0 Single Caucasian female here for annual exam.    PCP:   Shirlean Mylar  Patient's last menstrual period was 05/09/2012.           Sexually active: No.  The current method of family planning is post menopausal status.    Exercising: No.  walking Smoker:  no  Health Maintenance: Pap:  07/15/15 Neg. HR HPV:neg  History of abnormal Pap:  no MMG:  07/06/18 BIRADS1:Neg  Colonoscopy:  2010 Normal  BMD:  appt scheduled 08/13/18 TDaP:  2019 Hep C: 07/16/16 Neg - PCP HIV: 07/16/16 Neg - PCP Screening Labs: PCP   reports that she has never smoked. She has never used smokeless tobacco. She reports that she does not drink alcohol or use drugs.  Past Medical History:  Diagnosis Date  . Focal nodular hyperplasia of liver   . PCOS (polycystic ovarian syndrome)   . Thyroid disease    hypothyroidism    Past Surgical History:  Procedure Laterality Date  . gartners duct cyst Right    -vaginal wall  . ROOT CANAL  06/05/14   Tooth Capped     Current Outpatient Medications  Medication Sig Dispense Refill  . calcium carbonate (OS-CAL - DOSED IN MG OF ELEMENTAL CALCIUM) 1250 (500 Ca) MG tablet Take 1 tablet by mouth.    . cholecalciferol (VITAMIN D3) 25 MCG (1000 UT) tablet Take 1,000 Units by mouth daily.    Marland Kitchen levothyroxine (SYNTHROID, LEVOTHROID) 50 MCG tablet Take 50 mcg by mouth daily before breakfast. Take 1/2 tablet 7 days weekly    . Multiple Vitamin (MULTIVITAMIN) tablet Take 1 tablet by mouth daily.    . Omega-3 Fatty Acids (FISH OIL) 1000 MG CAPS Take by mouth.    . Probiotic Product (PROBIOTIC-10 PO) Take by mouth.     No current facility-administered medications for this visit.     Family History  Problem Relation Age of Onset  . Hypertension Mother   . Thyroid disease Mother   . Hypertension Father   . Heart disease Father   . Skin cancer Father   . Breast cancer Neg Hx     Review of Systems  Constitutional:       Weigh gain   All other systems reviewed and are  negative.   Exam:   BP (!) 144/80 (BP Location: Right Arm, Patient Position: Sitting, Cuff Size: Normal)   Pulse 72   Resp 16   Ht 5' 2.5" (1.588 m)   Wt 155 lb 12.8 oz (70.7 kg)   LMP 05/09/2012   BMI 28.04 kg/m     General appearance: alert, cooperative and appears stated age Head: Normocephalic, without obvious abnormality, atraumatic Neck: no adenopathy, supple, symmetrical, trachea midline and thyroid normal to inspection and palpation Lungs: clear to auscultation bilaterally Breasts: normal appearance, no masses or tenderness, No nipple retraction or dimpling, No nipple discharge or bleeding, No axillary or supraclavicular adenopathy Heart: regular rate and rhythm Abdomen: soft, non-tender; no masses, no organomegaly Extremities: extremities normal, atraumatic, no cyanosis or edema Skin: Skin color, texture, turgor normal. No rashes or lesions Lymph nodes: Cervical, supraclavicular, and axillary nodes normal. No abnormal inguinal nodes palpated Neurologic: Grossly normal  Pelvic: External genitalia:  no lesions              Urethra:  normal appearing urethra with no masses, tenderness or lesions              Bartholins and Skenes: normal  Vagina: normal appearing vagina with normal color and discharge, no lesions              Cervix: no lesions              Pap taken: Yes.   Bimanual Exam:  Uterus:  normal size, contour, position, consistency, mobility, non-tender.  BM exam limited by inability to relax.               Adnexa: no mass, fullness, tenderness              Rectal exam: Yes.  .  Confirms.              Anus:  normal sphincter tone, no lesions  Chaperone was present for exam.  Assessment:   Well woman visit with normal exam. Painful pelvic exams. Using progesterone cream. Vaginal wall cyst not palpated today.   Liver focal nodular hyperplasia. Resolved. Hypothyroidism.   Plan: Mammogram screening. Recommended self breast awareness. Pap  and HR HPV as above. Guidelines for Calcium, Vitamin D, regular exercise program including cardiovascular and weight bearing exercise. BMD this year.  Patient may do her Cologuard through her PCP.  I discussed flu vaccine and Coronoavirus precautions.  Follow up annually and prn.   After visit summary provided.

## 2018-07-24 NOTE — Patient Instructions (Signed)

## 2018-07-26 LAB — CYTOLOGY - PAP
Diagnosis: NEGATIVE
HPV: NOT DETECTED

## 2018-08-01 DIAGNOSIS — Z0000 Encounter for general adult medical examination without abnormal findings: Secondary | ICD-10-CM | POA: Diagnosis not present

## 2018-08-01 DIAGNOSIS — E039 Hypothyroidism, unspecified: Secondary | ICD-10-CM | POA: Diagnosis not present

## 2018-08-01 DIAGNOSIS — Z1211 Encounter for screening for malignant neoplasm of colon: Secondary | ICD-10-CM | POA: Diagnosis not present

## 2018-08-01 DIAGNOSIS — E785 Hyperlipidemia, unspecified: Secondary | ICD-10-CM | POA: Diagnosis not present

## 2018-08-01 DIAGNOSIS — Z5181 Encounter for therapeutic drug level monitoring: Secondary | ICD-10-CM | POA: Diagnosis not present

## 2018-08-06 ENCOUNTER — Ambulatory Visit: Payer: BLUE CROSS/BLUE SHIELD | Admitting: Obstetrics and Gynecology

## 2018-08-06 ENCOUNTER — Encounter

## 2018-08-13 ENCOUNTER — Other Ambulatory Visit: Payer: BLUE CROSS/BLUE SHIELD

## 2018-10-15 ENCOUNTER — Other Ambulatory Visit: Payer: Self-pay

## 2018-10-15 ENCOUNTER — Ambulatory Visit
Admission: RE | Admit: 2018-10-15 | Discharge: 2018-10-15 | Disposition: A | Discharge status: Home / Self Care | Payer: BLUE CROSS/BLUE SHIELD | Source: Ambulatory Visit | Attending: Obstetrics and Gynecology | Admitting: Obstetrics and Gynecology

## 2018-10-15 DIAGNOSIS — Z780 Asymptomatic menopausal state: Secondary | ICD-10-CM | POA: Diagnosis not present

## 2018-10-15 DIAGNOSIS — M8589 Other specified disorders of bone density and structure, multiple sites: Secondary | ICD-10-CM | POA: Diagnosis not present

## 2018-10-18 ENCOUNTER — Encounter: Payer: Self-pay | Admitting: Obstetrics and Gynecology

## 2018-10-18 ENCOUNTER — Telehealth: Payer: Self-pay | Admitting: Obstetrics and Gynecology

## 2018-10-18 NOTE — Telephone Encounter (Signed)
    Patient calling for bone density results 

## 2018-10-18 NOTE — Telephone Encounter (Signed)
Spoke with patient. She reviewed BMD results on MyChart and has no questions.

## 2018-10-18 NOTE — Telephone Encounter (Signed)
Results just released to patient through My Chart.  She has osteopenia.  Recommendations given in My Chart message.

## 2018-10-18 NOTE — Telephone Encounter (Signed)
Routed to Dr.Silva to review bone density report.

## 2019-03-18 ENCOUNTER — Other Ambulatory Visit: Payer: Self-pay

## 2019-03-18 DIAGNOSIS — Z20822 Contact with and (suspected) exposure to covid-19: Secondary | ICD-10-CM

## 2019-03-20 LAB — NOVEL CORONAVIRUS, NAA: SARS-CoV-2, NAA: NOT DETECTED

## 2019-05-23 ENCOUNTER — Other Ambulatory Visit: Payer: Self-pay | Admitting: Obstetrics and Gynecology

## 2019-05-23 DIAGNOSIS — Z1231 Encounter for screening mammogram for malignant neoplasm of breast: Secondary | ICD-10-CM

## 2019-07-08 ENCOUNTER — Ambulatory Visit
Admission: RE | Admit: 2019-07-08 | Discharge: 2019-07-08 | Disposition: A | Discharge status: Home / Self Care | Payer: BC Managed Care – PPO | Source: Ambulatory Visit | Attending: Obstetrics and Gynecology | Admitting: Obstetrics and Gynecology

## 2019-07-08 ENCOUNTER — Other Ambulatory Visit: Payer: Self-pay

## 2019-07-08 DIAGNOSIS — Z1231 Encounter for screening mammogram for malignant neoplasm of breast: Secondary | ICD-10-CM | POA: Diagnosis not present

## 2019-07-24 ENCOUNTER — Other Ambulatory Visit: Payer: Self-pay

## 2019-07-24 NOTE — Progress Notes (Signed)
64 y.o. G0P0 Single Caucasian female here for annual exam.    Asking about stopping pap smears unless she becomes sexually active.   She sees [facility], holistic specialist.  She is using progesterone cream 2 weeks on and 2 weeks off.   PCP: Maurice Small, MD     Patient's last menstrual period was 05/09/2012.           Sexually active: No.  The current method of family planning is post menopausal status.    Exercising: No.  The patient does not participate in regular exercise at present. Smoker:  no  Health Maintenance: Pap: 07-24-18 Neg:Neg HR HPV, 07-15-15 Neg:Neg HR HPV, 06-10-11 Neg:Neg HR HPV History of abnormal Pap:  no MMG:  07-08-19 3D/Neg/density B/BiRads1 Colonoscopy: 2010 normal--Doing Ifob yearly with PCP BMD: 10-15-18  Result :Osteopenia TDaP: 07-20-17 Gardasil:   no HIV:07/16/16 Neg - PCP Hep C:07/16/16 Neg - PCP Screening Labs:  PCP.   reports that she has never smoked. She has never used smokeless tobacco. She reports that she does not drink alcohol or use drugs.  Past Medical History:  Diagnosis Date  . Focal nodular hyperplasia of liver   . Osteopenia 2020  . PCOS (polycystic ovarian syndrome)   . Thyroid disease    hypothyroidism    Past Surgical History:  Procedure Laterality Date  . gartners duct cyst Right    -vaginal wall  . ROOT CANAL  06/05/14   Tooth Capped     Current Outpatient Medications  Medication Sig Dispense Refill  . cholecalciferol (VITAMIN D3) 25 MCG (1000 UT) tablet Take 1,000 Units by mouth daily.    Marland Kitchen levothyroxine (SYNTHROID, LEVOTHROID) 50 MCG tablet Take 50 mcg by mouth daily before breakfast. Take 1/2 tablet 7 days weekly    . Omega-3 Fatty Acids (FISH OIL) 1000 MG CAPS Take by mouth.    . calcium carbonate (OS-CAL - DOSED IN MG OF ELEMENTAL CALCIUM) 1250 (500 Ca) MG tablet Take 1 tablet by mouth.    . Multiple Vitamin (MULTIVITAMIN) tablet Take 1 tablet by mouth daily.    . Probiotic Product (PROBIOTIC-10 PO) Take by mouth.      No current facility-administered medications for this visit.    Family History  Problem Relation Age of Onset  . Hypertension Mother   . Thyroid disease Mother   . Hypertension Father   . Heart disease Father   . Skin cancer Father   . Breast cancer Neg Hx     Review of Systems  All other systems reviewed and are negative.   Exam:   BP (!) 168/88   Pulse 60   Temp (!) 97 F (36.1 C) (Temporal)   Resp 16   Ht 5\' 2"  (1.575 m)   Wt 155 lb (70.3 kg)   LMP 05/09/2012   BMI 28.35 kg/m     General appearance: alert, cooperative and appears stated age Head: normocephalic, without obvious abnormality, atraumatic Neck: no adenopathy, supple, symmetrical, trachea midline and thyroid normal to inspection and palpation Lungs: clear to auscultation bilaterally Breasts: normal appearance, no masses or tenderness, No nipple retraction or dimpling, No nipple discharge or bleeding, No axillary adenopathy Heart: regular rate and rhythm Abdomen: soft, non-tender; no masses, no organomegaly Extremities: extremities normal, atraumatic, no cyanosis or edema Skin: skin color, texture, turgor normal. No rashes or lesions Lymph nodes: cervical, supraclavicular, and axillary nodes normal. Neurologic: grossly normal  Pelvic: External genitalia:  no lesions  No abnormal inguinal nodes palpated.              Urethra:  normal appearing urethra with no masses, tenderness or lesions              Bartholins and Skenes: normal                 Vagina: normal appearing vagina with normal color and discharge, no lesions              Cervix: no lesions              Pap taken: No. Bimanual Exam:  Uterus:  normal size, contour, position, consistency, mobility, non-tender              Adnexa: no mass, fullness, tenderness              Rectal exam: Yes.  .  Confirms.              Anus:  normal sphincter tone, no lesions  Chaperone was present for exam.  Assessment:   Well woman visit with  normal exam. Painful pelvic exams. Using progesterone cream. Vaginal wall cyst not palpated today.   Liver focal nodular hyperplasia. Resolved. Hypothyroidism. Osteopenia.  Elevated blood pressure today,   Plan: Mammogram screening discussed. Self breast awareness reviewed. Cervical cancer screening guidelines reviewed.  She may opt out of paps unless becomes sexually active.  Guidelines for Calcium, Vitamin D, regular exercise program including cardiovascular and weight bearing exercise. BMD in 2022.  Fu with PCP for blood pressure recheck. Follow up annually and prn.

## 2019-07-25 ENCOUNTER — Ambulatory Visit (INDEPENDENT_AMBULATORY_CARE_PROVIDER_SITE_OTHER): Payer: BC Managed Care – PPO | Admitting: Obstetrics and Gynecology

## 2019-07-25 ENCOUNTER — Encounter: Payer: Self-pay | Admitting: Obstetrics and Gynecology

## 2019-07-25 VITALS — BP 168/88 | HR 60 | Temp 97.0°F | Resp 16 | Ht 62.0 in | Wt 155.0 lb

## 2019-07-25 DIAGNOSIS — Z01419 Encounter for gynecological examination (general) (routine) without abnormal findings: Secondary | ICD-10-CM

## 2019-07-25 NOTE — Patient Instructions (Signed)

## 2019-08-11 IMAGING — MG DIGITAL SCREENING BILATERAL MAMMOGRAM WITH TOMO AND CAD
8 series · 9 of 24 positions shown · non-contrast
Comparison: Previous exam(s).

CLINICAL DATA: Screening.

EXAM:
DIGITAL SCREENING BILATERAL MAMMOGRAM WITH TOMO AND CAD

[R CC synth-2D]
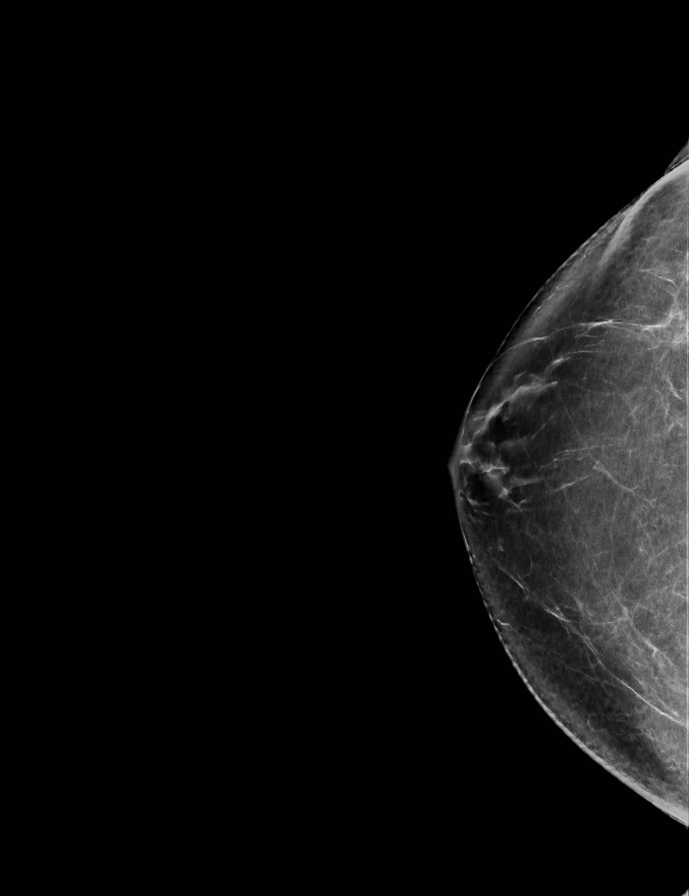

[L CC synth-2D]
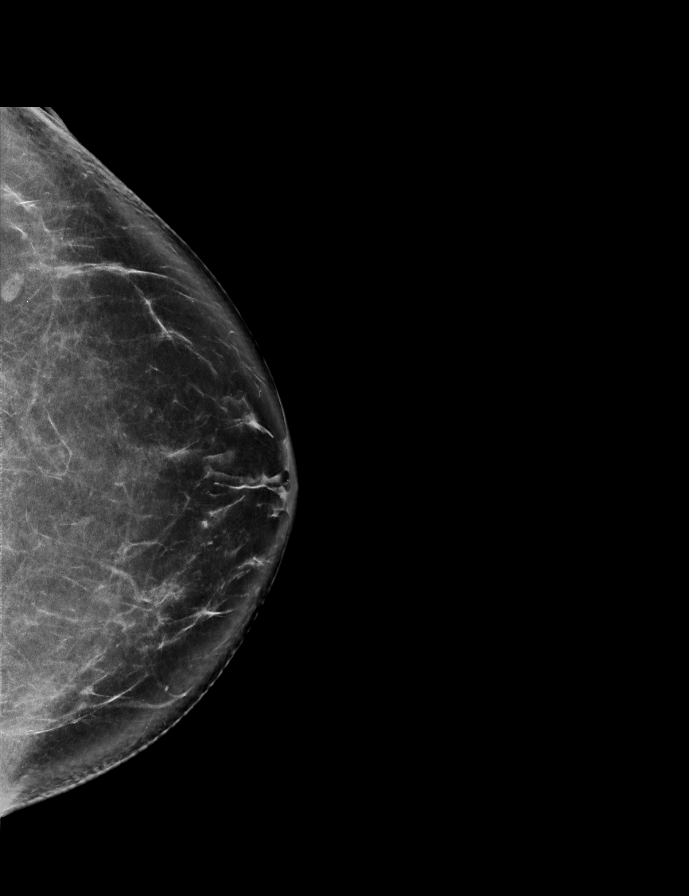

[L MLO synth-2D]
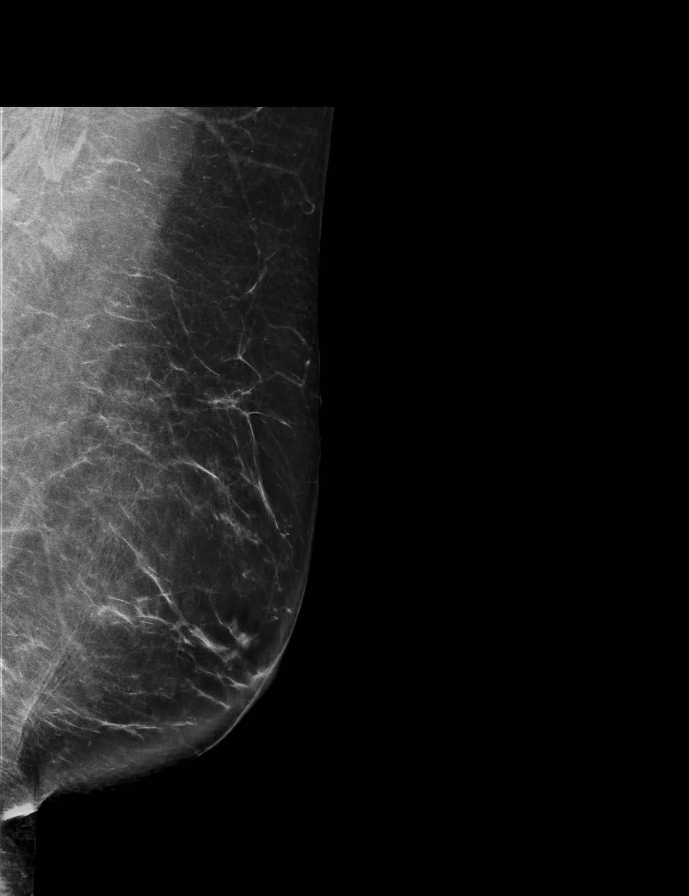

[R MLO synth-2D]
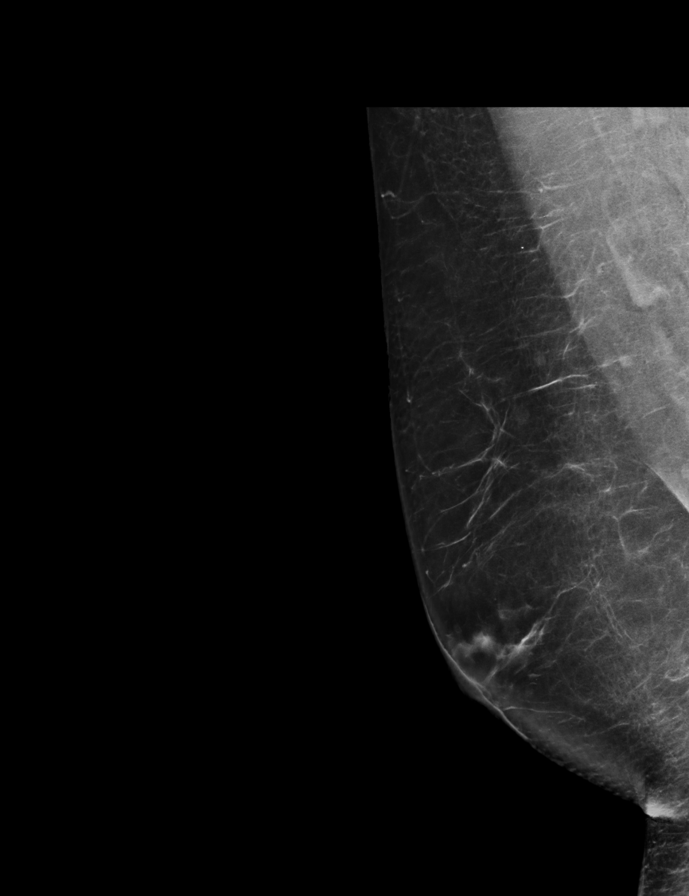

[R CC tomo · 2 of 79 frames shown]
[frame 26/79]
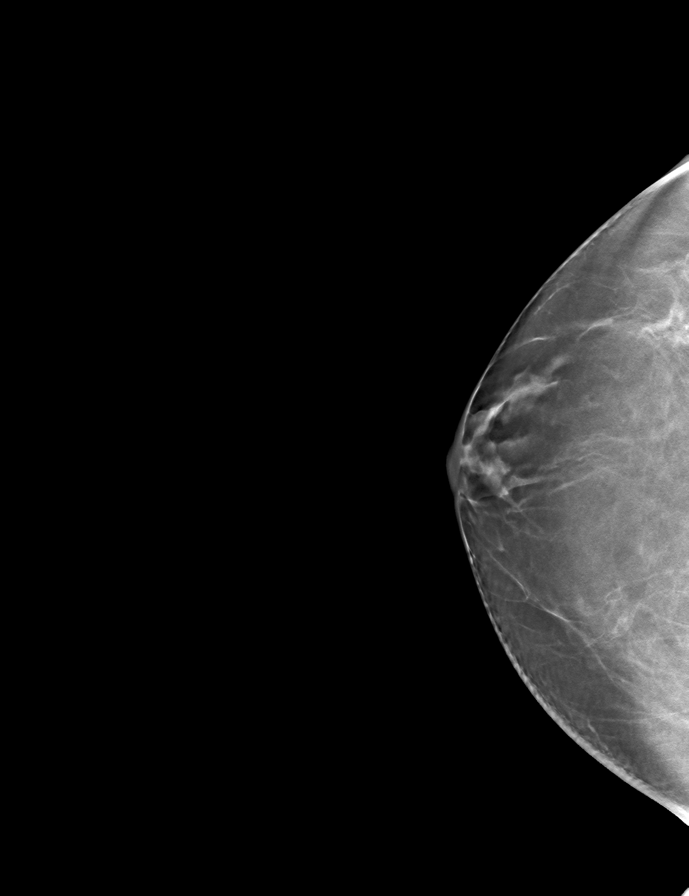
[frame 40/79]
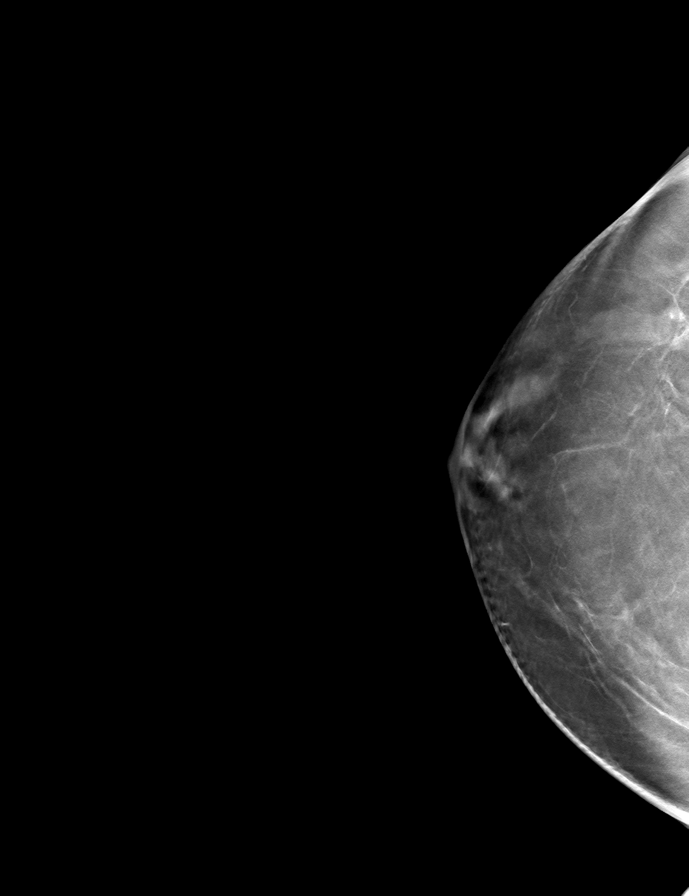

[L CC tomo · tomo slice 43/84.0]
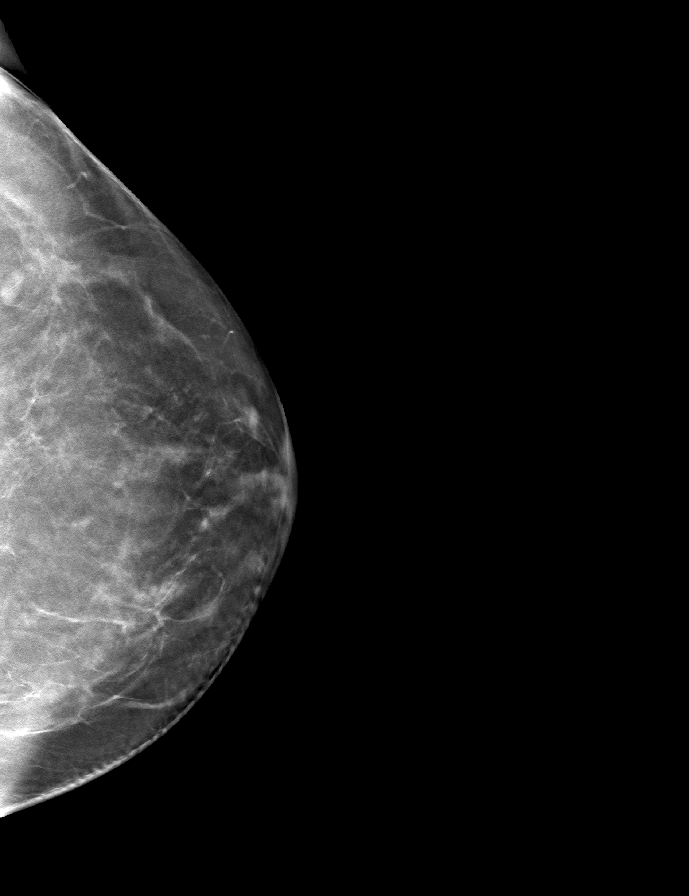

[R MLO tomo · tomo slice 43/84.0]
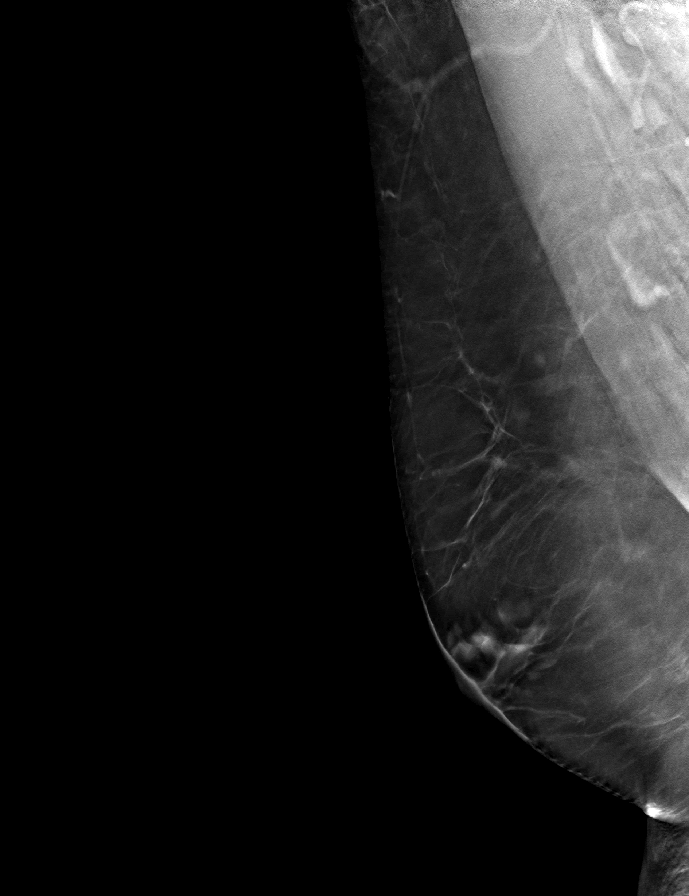

[L MLO tomo · tomo slice 45/90.0]
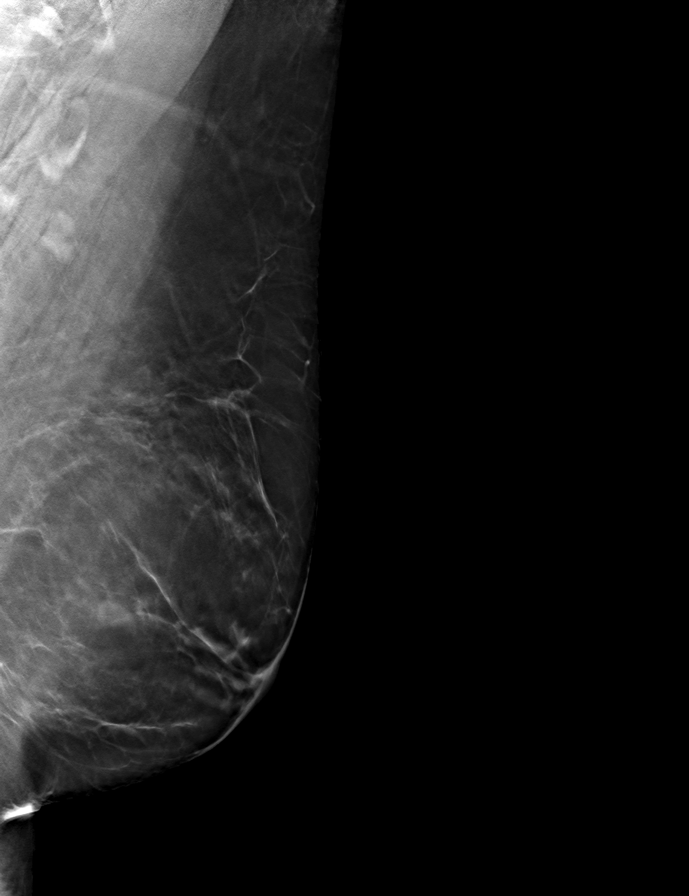

[9 of 24 positions shown; findings below may reference images not displayed]

ACR Breast Density Category b: There are scattered areas of
fibroglandular density.
FINDINGS: There are no findings suspicious for malignancy. Images were
processed with CAD.
IMPRESSION: No mammographic evidence of malignancy. A result letter of this
screening mammogram will be mailed directly to the patient.

RECOMMENDATION:
Screening mammogram in one year. (Code:[id number])

BI-RADS CATEGORY  1: Negative.

## 2019-08-12 DIAGNOSIS — Z5181 Encounter for therapeutic drug level monitoring: Secondary | ICD-10-CM | POA: Diagnosis not present

## 2019-08-12 DIAGNOSIS — Z0000 Encounter for general adult medical examination without abnormal findings: Secondary | ICD-10-CM | POA: Diagnosis not present

## 2019-08-12 DIAGNOSIS — E785 Hyperlipidemia, unspecified: Secondary | ICD-10-CM | POA: Diagnosis not present

## 2019-08-12 DIAGNOSIS — E039 Hypothyroidism, unspecified: Secondary | ICD-10-CM | POA: Diagnosis not present

## 2019-08-13 DIAGNOSIS — H5213 Myopia, bilateral: Secondary | ICD-10-CM | POA: Diagnosis not present

## 2019-08-13 DIAGNOSIS — H10413 Chronic giant papillary conjunctivitis, bilateral: Secondary | ICD-10-CM | POA: Diagnosis not present

## 2019-08-15 DIAGNOSIS — D2262 Melanocytic nevi of left upper limb, including shoulder: Secondary | ICD-10-CM | POA: Diagnosis not present

## 2019-08-15 DIAGNOSIS — D1801 Hemangioma of skin and subcutaneous tissue: Secondary | ICD-10-CM | POA: Diagnosis not present

## 2019-08-15 DIAGNOSIS — D2361 Other benign neoplasm of skin of right upper limb, including shoulder: Secondary | ICD-10-CM | POA: Diagnosis not present

## 2019-08-15 DIAGNOSIS — L814 Other melanin hyperpigmentation: Secondary | ICD-10-CM | POA: Diagnosis not present

## 2019-08-15 DIAGNOSIS — D225 Melanocytic nevi of trunk: Secondary | ICD-10-CM | POA: Diagnosis not present

## 2019-08-15 DIAGNOSIS — Z1211 Encounter for screening for malignant neoplasm of colon: Secondary | ICD-10-CM | POA: Diagnosis not present

## 2020-01-08 DIAGNOSIS — S82002A Unspecified fracture of left patella, initial encounter for closed fracture: Secondary | ICD-10-CM

## 2020-01-08 DIAGNOSIS — W133XXA Fall through floor, initial encounter: Secondary | ICD-10-CM | POA: Diagnosis not present

## 2020-01-08 DIAGNOSIS — Y9289 Other specified places as the place of occurrence of the external cause: Secondary | ICD-10-CM | POA: Diagnosis not present

## 2020-01-08 DIAGNOSIS — S82032A Displaced transverse fracture of left patella, initial encounter for closed fracture: Secondary | ICD-10-CM | POA: Diagnosis not present

## 2020-01-08 DIAGNOSIS — Y999 Unspecified external cause status: Secondary | ICD-10-CM | POA: Diagnosis not present

## 2020-01-08 DIAGNOSIS — E039 Hypothyroidism, unspecified: Secondary | ICD-10-CM | POA: Diagnosis not present

## 2020-01-08 DIAGNOSIS — S8992XA Unspecified injury of left lower leg, initial encounter: Secondary | ICD-10-CM | POA: Diagnosis not present

## 2020-01-08 HISTORY — DX: Unspecified fracture of left patella, initial encounter for closed fracture: S82.002A

## 2020-01-15 ENCOUNTER — Ambulatory Visit (INDEPENDENT_AMBULATORY_CARE_PROVIDER_SITE_OTHER): Payer: BC Managed Care – PPO | Admitting: Physician Assistant

## 2020-01-15 ENCOUNTER — Encounter: Payer: Self-pay | Admitting: Physician Assistant

## 2020-01-15 ENCOUNTER — Ambulatory Visit (INDEPENDENT_AMBULATORY_CARE_PROVIDER_SITE_OTHER): Payer: BC Managed Care – PPO

## 2020-01-15 DIAGNOSIS — M25562 Pain in left knee: Secondary | ICD-10-CM | POA: Diagnosis not present

## 2020-01-15 DIAGNOSIS — S82002A Unspecified fracture of left patella, initial encounter for closed fracture: Secondary | ICD-10-CM | POA: Diagnosis not present

## 2020-01-15 DIAGNOSIS — S82012A Displaced osteochondral fracture of left patella, initial encounter for closed fracture: Secondary | ICD-10-CM | POA: Diagnosis not present

## 2020-01-15 MED ORDER — LIDOCAINE 5 % EX PTCH: 1.0000 | MEDICATED_PATCH | CUTANEOUS | 0 refills | Status: DC

## 2020-01-15 NOTE — Progress Notes (Signed)
Office Visit Note   Patient: Jillian Martinez           Date of Birth: Nov 29, 1955           MRN: [id number] Visit Date: 01/15/2020              Requested by: Shirlean Mylar, MD [address] [place],  [place] [address] PCP: Shirlean Mylar, MD   Assessment & Plan: Visit Diagnoses:  1. Acute pain of left knee     Plan: She is given a prescription for a Bledsoe brace.  She'll obtain a Bledsoe brace which will be locked out at full extension.  She is to wear the brace at all times except for hygiene purposes.  Instructed her when showering that she needs to keep the leg straight.  See her back in 2 weeks at that time we'll obtain lateral view of the left knee.  Questions were encouraged and answered at length.  Follow-Up Instructions: Return in about 2 weeks (around 01/29/2020) for Radiographs.   Orders:  Orders Placed This Encounter  Procedures  . XR Knee 1-2 Views Left   Meds ordered this encounter  Medications  . lidocaine (LIDODERM) 5 %    Sig: Place 1 patch onto the skin daily. Remove & Discard patch within 12 hours or as directed by MD    Dispense:  30 patch    Refill:  0      Procedures: No procedures performed   Clinical Data: No additional findings.   Subjective: Chief Complaint  Patient presents with  . Left Knee - Fracture    HPI [place] is a 64 year old female comes in today with a left patella fracture.  She sustained the left patella fracture while she was in [place].  She reports that she fell on a marble floor and hit her knee.  She had no other injuries.  She is seen in the ER there and brings this with her showing the minimally displaced patella fracture.  She was placed appropriately in a knee immobilizer.  She has been taking this off at times and has been to the knee some.  She is using lidocaine patches for pain.  They did place her on aspirin for DVT prophylaxis which she has had no adverse effects to it. Review of Systems See HPI  otherwise negative or not covered to work.  Objective: Vital Signs: LMP 05/09/2012   Physical Exam General: Well-developed well-nourished female no acute distress mood affect appropriate. Psych: Alert and oriented x3. Ortho Exam Left knee: Slight edema.  No manipulation knee performed today.  Calf is supple.  Both knees slightly hyperextend. Specialty Comments:  No specialty comments available.  Imaging: XR Knee 1-2 Views Left  Result Date: 01/15/2020 AP lateral views left knee: Shows a slightly minimally displaced patella fracture which is actually in overall better position alignment compared to the films that she brought with her which were taken on 01/08/2020.  No other fractures identified.    PMFS History: There are no problems to display for this patient.  Past Medical History:  Diagnosis Date  . Focal nodular hyperplasia of liver   . Osteopenia 2020  . PCOS (polycystic ovarian syndrome)   . Thyroid disease    hypothyroidism    Family History  Problem Relation Age of Onset  . Hypertension Mother   . Thyroid disease Mother   . Hypertension Father   . Heart disease Father   . Skin cancer Father   .  Breast cancer Neg Hx     Past Surgical History:  Procedure Laterality Date  . gartners duct cyst Right    -vaginal wall  . ROOT CANAL  06/05/14   Tooth Capped    Social History   Occupational History  . Not on file  Tobacco Use  . Smoking status: Never Smoker  . Smokeless tobacco: Never Used  Vaping Use  . Vaping Use: Never used  Substance and Sexual Activity  . Alcohol use: No  . Drug use: No  . Sexual activity: Not Currently    Partners: Male    Birth control/protection: Post-menopausal

## 2020-01-16 ENCOUNTER — Other Ambulatory Visit: Payer: Self-pay

## 2020-01-16 ENCOUNTER — Telehealth: Payer: Self-pay | Admitting: Physician Assistant

## 2020-01-16 MED ORDER — LIDOCAINE 5 % EX PTCH: 1.0000 | MEDICATED_PATCH | CUTANEOUS | 0 refills | Status: DC

## 2020-01-16 NOTE — Telephone Encounter (Signed)
Sent!

## 2020-01-16 NOTE — Telephone Encounter (Signed)
Patient called.   She is asking that we send the Rx she was just given to [facility] on [address] for 15-20 patches. She is having an issue with her insurance covering the one that was already sent in   Call back: [phone]

## 2020-01-28 DIAGNOSIS — Z20828 Contact with and (suspected) exposure to other viral communicable diseases: Secondary | ICD-10-CM | POA: Diagnosis not present

## 2020-01-29 ENCOUNTER — Ambulatory Visit (INDEPENDENT_AMBULATORY_CARE_PROVIDER_SITE_OTHER): Payer: BC Managed Care – PPO | Admitting: Physician Assistant

## 2020-01-29 ENCOUNTER — Ambulatory Visit: Payer: Self-pay

## 2020-01-29 ENCOUNTER — Encounter: Payer: Self-pay | Admitting: Physician Assistant

## 2020-01-29 DIAGNOSIS — S82035D Nondisplaced transverse fracture of left patella, subsequent encounter for closed fracture with routine healing: Secondary | ICD-10-CM

## 2020-01-29 DIAGNOSIS — M25562 Pain in left knee: Secondary | ICD-10-CM

## 2020-01-29 NOTE — Progress Notes (Signed)
HPI: Jillian Martinez returns today follow-up of her left patella fracture.  She is in a Bledsoe knee brace.  She is having no pain.  She is concerned due to some upcoming travel plans that she has and how the brace will affect her travels.   Physical exam: General well-developed well-nourished female no acute distress. Right knee and Bledsoe brace rashes skin lesions.  Radiographs:Lateral view left knee: No change in overall position alignment of the patella fracture.  Early signs of consolidation are seen.  No other bony abnormalities.   Impression: Left knee patella fracture  Plan: We will see her back in 2 weeks at that time obtain a lateral view of the left knee.  We will open the brace at that time 0 to 30 degrees today kept the knee locked out in full extension.  Questions were encouraged and answered at length.

## 2020-02-12 ENCOUNTER — Ambulatory Visit (INDEPENDENT_AMBULATORY_CARE_PROVIDER_SITE_OTHER): Payer: BC Managed Care – PPO | Admitting: Physician Assistant

## 2020-02-12 ENCOUNTER — Encounter: Payer: Self-pay | Admitting: Physician Assistant

## 2020-02-12 ENCOUNTER — Ambulatory Visit: Payer: Self-pay

## 2020-02-12 DIAGNOSIS — M25562 Pain in left knee: Secondary | ICD-10-CM | POA: Diagnosis not present

## 2020-02-12 NOTE — Progress Notes (Signed)
HPI: Mrs. Kalina returns today follow-up of her left patella fracture.  She is now approximately 5 weeks status post injury.  She is walking very full weight in the Bledsoe brace 0 to 30 degrees using a cane.  She has been overall is doing well.  Physical exam: Right knee she is able do straight leg raise.  Right calf supple nontender.  Radiographs: Lateral view left knee shows the patella running in overall good position alignment.  There is signs of early consolidation.  No other fractures identified.  Impression: 5 weeks status post left patella fracture  Plan: We will have her brace 0 to 60 degrees of flexion.  We will see her back in just 2 weeks due to the fact that she is going on a trip.  Will obtain a lateral view of the knee at that time most likely open her brace 0 to 90 degrees.  Questions were encouraged and answered at length.

## 2020-02-23 DIAGNOSIS — Z20822 Contact with and (suspected) exposure to covid-19: Secondary | ICD-10-CM | POA: Diagnosis not present

## 2020-02-25 ENCOUNTER — Ambulatory Visit (INDEPENDENT_AMBULATORY_CARE_PROVIDER_SITE_OTHER): Payer: BC Managed Care – PPO

## 2020-02-25 ENCOUNTER — Ambulatory Visit (INDEPENDENT_AMBULATORY_CARE_PROVIDER_SITE_OTHER): Payer: BC Managed Care – PPO | Admitting: Orthopaedic Surgery

## 2020-02-25 DIAGNOSIS — M25562 Pain in left knee: Secondary | ICD-10-CM

## 2020-02-25 NOTE — Progress Notes (Signed)
Office Visit Note   Patient: Jillian Martinez           Date of Birth: 09/04/1955           MRN: [id number] Visit Date: 02/25/2020              Requested by: Shirlean Mylar, MD [address] [place],  [place] [address] PCP: Shirlean Mylar, MD   Assessment & Plan: Visit Diagnoses:  1. Left knee pain, unspecified chronicity     Plan: She is healing the fracture enough that now she can be out of her Bledsoe hinged knee brace. We will put her in a small hinged knee brace just to keep her from flexing past 90 degrees. She can weight-bear as tolerated and increase her activities as comfort allows. I will have her work on straight leg raises. We will see her back for a final visit in 3 weeks for final lateral of her left knee. At that visit we can determine whether or not she would like to have any physical therapy for her knee.  Follow-Up Instructions: Return in about 3 weeks (around 03/17/2020).   Orders:  Orders Placed This Encounter  Procedures   XR Knee 1-2 Views Left   No orders of the defined types were placed in this encounter.     Procedures: No procedures performed   Clinical Data: No additional findings.   Subjective: Chief Complaint  Patient presents with   Knee Pain    f/u left patella fx  The patient is now almost 7 weeks status post a nondisplaced patella fracture after mechanical fall. This injured her left patella. Her original injury was in [place] around 1 September. She has been in a Bledsoe hinged knee brace. At her last visit we opened it up from 0 to 60 degrees flexion. She is doing well overall.  HPI  Review of Systems   Objective: Vital Signs: LMP 05/09/2012   Physical Exam She is alert and oriented in no acute distress Ortho Exam Examination of her left knee does not show any significant swelling. Palpating her patella shows that is intact. She can hold her knee fully extended without any difficulty. I had her flex to about 70  degrees and she can extend her knee without difficulty. Specialty Comments:  No specialty comments available.  Imaging: XR Knee 1-2 Views Left  Result Date: 02/25/2020 A single view of the left knee shows a healing patella fracture which is significantly improved from prior films. There is no displacement of the fracture    PMFS History: There are no problems to display for this patient.  Past Medical History:  Diagnosis Date   Focal nodular hyperplasia of liver    Osteopenia 2020   PCOS (polycystic ovarian syndrome)    Thyroid disease    hypothyroidism    Family History  Problem Relation Age of Onset   Hypertension Mother    Thyroid disease Mother    Hypertension Father    Heart disease Father    Skin cancer Father    Breast cancer Neg Hx     Past Surgical History:  Procedure Laterality Date   gartners duct cyst Right    -vaginal wall   ROOT CANAL  06/05/14   Tooth Capped    Social History   Occupational History   Not on file  Tobacco Use   Smoking status: Never Smoker   Smokeless tobacco: Never Used  Vaping Use   Vaping Use: Never  used  Substance and Sexual Activity   Alcohol use: No   Drug use: No   Sexual activity: Not Currently    Partners: Male    Birth control/protection: Post-menopausal

## 2020-03-17 ENCOUNTER — Ambulatory Visit (INDEPENDENT_AMBULATORY_CARE_PROVIDER_SITE_OTHER): Payer: BC Managed Care – PPO | Admitting: Orthopaedic Surgery

## 2020-03-17 ENCOUNTER — Encounter: Payer: Self-pay | Admitting: Orthopaedic Surgery

## 2020-03-17 ENCOUNTER — Ambulatory Visit: Payer: Self-pay

## 2020-03-17 DIAGNOSIS — M25562 Pain in left knee: Secondary | ICD-10-CM

## 2020-03-17 NOTE — Progress Notes (Signed)
The patient is now 10 weeks status post a nondisplaced left patella fracture.  We had her in a hinged knee brace and more recently a small brace.  She has been weightbearing as tolerated.  She does report some left knee stiffness and she has been walking a lot.  She walked 4 miles recently in [place].  Examination of her left knee shows no effusion.  Her extension is full and her flexion is past 110 degrees.  The knee is ligamentously stable.  She has 5 out of 5 quad strength.  A single lateral view of the left knee shows that the patella fracture has healed.  At this point she will work on quad strengthening exercises and can stop wearing any brace.  I have recommended outpatient physical therapy if she is not making progress in at least 2 weeks from now with her own exercise routine.  All questions and concerns were answered and addressed.  Follow-up can be as needed.

## 2020-05-26 ENCOUNTER — Other Ambulatory Visit: Payer: Self-pay | Admitting: Obstetrics and Gynecology

## 2020-05-26 DIAGNOSIS — Z1231 Encounter for screening mammogram for malignant neoplasm of breast: Secondary | ICD-10-CM

## 2020-07-09 ENCOUNTER — Other Ambulatory Visit: Payer: Self-pay

## 2020-07-09 ENCOUNTER — Ambulatory Visit
Admission: RE | Admit: 2020-07-09 | Discharge: 2020-07-09 | Disposition: A | Discharge status: Home / Self Care | Payer: BC Managed Care – PPO | Source: Ambulatory Visit | Attending: Obstetrics and Gynecology | Admitting: Obstetrics and Gynecology

## 2020-07-09 DIAGNOSIS — Z1231 Encounter for screening mammogram for malignant neoplasm of breast: Secondary | ICD-10-CM | POA: Diagnosis not present

## 2020-07-28 NOTE — Progress Notes (Signed)
65 y.o. G0P0 Single Caucasian female here for annual exam.    No vaginal bleeding or discharge. Chooses to stop Paps unless she is sexually active again.  Had a left patellar fracture after falling on a marble floor in [place].   Takes calcium as recommended through [employer].  Also using progesterone cream 2 weeks on and 2 weeks off.   Received her Covid booster.   PCP: Shirlean Mylar, MD    Patient's last menstrual period was 05/09/2012.           Sexually active: No.  The current method of family planning is post menopausal status.    Exercising: No.  The patient does not participate in regular exercise at present. Smoker:  no  Health Maintenance: Pap: 07-24-18 Neg:Neg HR HPV, 07-15-15 Neg:Neg HR HPV, 06-10-11 Neg:Neg HR HPV History of abnormal Pap:  no MMG: 07-09-20 3D/Neg/BiRads1 Colonoscopy:  2010 normal--Doing Ifob yearly with PCP BMD: 10-15-18  Result :Osteopenia TDaP:  07-20-17 Gardasil:   no HIV: 07-16-16 Neg - PCP Hep C: 07-16-16 - PCP Screening Labs:  PCP.   reports that she has never smoked. She has never used smokeless tobacco. She reports that she does not drink alcohol and does not use drugs.  Past Medical History:  Diagnosis Date  . Focal nodular hyperplasia of liver   . Left patella fracture 01/08/2020   no surgical care.  . Osteopenia 2020  . PCOS (polycystic ovarian syndrome)   . Thyroid disease    hypothyroidism    Past Surgical History:  Procedure Laterality Date  . gartners duct cyst Right    -vaginal wall  . ROOT CANAL  06/05/14   Tooth Capped     Current Outpatient Medications  Medication Sig Dispense Refill  . cholecalciferol (VITAMIN D3) 25 MCG (1000 UNIT) tablet 1 tablet    . levothyroxine (SYNTHROID) 50 MCG tablet Take 1 tablet by mouth daily.    . NON FORMULARY Digestive Enzymes    . Progesterone 40 % CREA by Does not apply route. 2 weeks on, then 2 weeks off     No current facility-administered medications for this visit.    Family  History  Problem Relation Age of Onset  . Hypertension Mother   . Thyroid disease Mother   . Hypertension Father   . Heart disease Father   . Skin cancer Father   . Breast cancer Neg Hx     Review of Systems  All other systems reviewed and are negative.   Exam:   BP (!) 148/88   Pulse 64   Ht 5\' 2"  (1.575 m)   Wt 153 lb (69.4 kg)   LMP 05/09/2012   SpO2 100%   BMI 27.98 kg/m     General appearance: alert, cooperative and appears stated age Head: normocephalic, without obvious abnormality, atraumatic Neck: no adenopathy, supple, symmetrical, trachea midline and thyroid normal to inspection and palpation Lungs: clear to auscultation bilaterally Breasts: normal appearance, no masses or tenderness, No nipple retraction or dimpling, No nipple discharge or bleeding, No axillary adenopathy Heart: regular rate and rhythm Abdomen: soft, non-tender; no masses, no organomegaly Extremities: extremities normal, atraumatic, no cyanosis or edema Skin: skin color, texture, turgor normal. No rashes or lesions Lymph nodes: cervical, supraclavicular, and axillary nodes normal. Neurologic: grossly normal  Pelvic: External genitalia:  no lesions              No abnormal inguinal nodes palpated.  Urethra:  normal appearing urethra with no masses, tenderness or lesions              Bartholins and Skenes: normal                 Vagina: normal appearing vagina with normal color and discharge, no lesions              Cervix: no lesions.  Entire cervix not seen.               Pap taken: No. Bimanual Exam:  Uterus:  normal size, contour, position, consistency, mobility, non-tender              Adnexa: no mass, fullness, tenderness              Rectal exam: Yes.  .  Confirms.              Anus:  normal sphincter tone, no lesions  Chaperone was present for exam.  Assessment:   Well woman visit with normal exam. Painful pelvic exams. Using progesterone cream. Vaginal wall cyst not  palpated today. Liver focal nodular hyperplasia. Resolved. Hypothyroidism. Osteopenia.   Plan: Mammogram screening discussed. Self breast awareness reviewed. Guidelines for Calcium, Vitamin D, regular exercise program including cardiovascular and weight bearing exercise. BMD ordered.  She may choose to do this next year with her mammogram. Labs with PCP. Follow up annually and prn.

## 2020-07-29 ENCOUNTER — Encounter: Payer: Self-pay | Admitting: Obstetrics and Gynecology

## 2020-07-29 ENCOUNTER — Other Ambulatory Visit: Payer: Self-pay

## 2020-07-29 ENCOUNTER — Ambulatory Visit (INDEPENDENT_AMBULATORY_CARE_PROVIDER_SITE_OTHER): Payer: BC Managed Care – PPO | Admitting: Obstetrics and Gynecology

## 2020-07-29 ENCOUNTER — Ambulatory Visit: Payer: BC Managed Care – PPO | Admitting: Obstetrics and Gynecology

## 2020-07-29 VITALS — BP 148/88 | HR 64 | Ht 62.0 in | Wt 153.0 lb

## 2020-07-29 DIAGNOSIS — Z780 Asymptomatic menopausal state: Secondary | ICD-10-CM

## 2020-07-29 DIAGNOSIS — Z01419 Encounter for gynecological examination (general) (routine) without abnormal findings: Secondary | ICD-10-CM

## 2020-07-29 NOTE — Patient Instructions (Signed)

## 2020-08-13 DIAGNOSIS — Z0000 Encounter for general adult medical examination without abnormal findings: Secondary | ICD-10-CM | POA: Diagnosis not present

## 2020-08-13 DIAGNOSIS — E785 Hyperlipidemia, unspecified: Secondary | ICD-10-CM | POA: Diagnosis not present

## 2020-08-13 DIAGNOSIS — E559 Vitamin D deficiency, unspecified: Secondary | ICD-10-CM | POA: Diagnosis not present

## 2020-08-13 DIAGNOSIS — E039 Hypothyroidism, unspecified: Secondary | ICD-10-CM | POA: Diagnosis not present

## 2020-08-13 DIAGNOSIS — Z5181 Encounter for therapeutic drug level monitoring: Secondary | ICD-10-CM | POA: Diagnosis not present

## 2020-08-19 DIAGNOSIS — Z1211 Encounter for screening for malignant neoplasm of colon: Secondary | ICD-10-CM | POA: Diagnosis not present

## 2020-08-24 DIAGNOSIS — D2239 Melanocytic nevi of other parts of face: Secondary | ICD-10-CM | POA: Diagnosis not present

## 2020-08-24 DIAGNOSIS — D225 Melanocytic nevi of trunk: Secondary | ICD-10-CM | POA: Diagnosis not present

## 2020-08-24 DIAGNOSIS — D485 Neoplasm of uncertain behavior of skin: Secondary | ICD-10-CM | POA: Diagnosis not present

## 2020-08-24 DIAGNOSIS — D2361 Other benign neoplasm of skin of right upper limb, including shoulder: Secondary | ICD-10-CM | POA: Diagnosis not present

## 2020-08-24 DIAGNOSIS — D2262 Melanocytic nevi of left upper limb, including shoulder: Secondary | ICD-10-CM | POA: Diagnosis not present

## 2020-08-24 DIAGNOSIS — D2261 Melanocytic nevi of right upper limb, including shoulder: Secondary | ICD-10-CM | POA: Diagnosis not present

## 2020-10-07 DIAGNOSIS — H10413 Chronic giant papillary conjunctivitis, bilateral: Secondary | ICD-10-CM | POA: Diagnosis not present

## 2020-10-07 DIAGNOSIS — H5213 Myopia, bilateral: Secondary | ICD-10-CM | POA: Diagnosis not present

## 2020-11-06 ENCOUNTER — Ambulatory Visit (INDEPENDENT_AMBULATORY_CARE_PROVIDER_SITE_OTHER): Payer: BC Managed Care – PPO | Admitting: Family Medicine

## 2020-11-06 ENCOUNTER — Ambulatory Visit: Payer: Self-pay

## 2020-11-06 ENCOUNTER — Other Ambulatory Visit: Payer: Self-pay

## 2020-11-06 DIAGNOSIS — M25562 Pain in left knee: Secondary | ICD-10-CM | POA: Diagnosis not present

## 2020-11-06 NOTE — Progress Notes (Signed)
Office Visit Note   Patient: Jillian Martinez           Date of Birth: May 29, 1955           MRN: [id number] Visit Date: 11/06/2020 Requested by: Shirlean Mylar, MD [address] [place],  [place] [address] PCP: Shirlean Mylar, MD  Subjective: Chief Complaint  Patient presents with   Left Knee - Injury    Hit kneecap against a booth at [facility] last week. Not having pain in it. Some tightness in the knee off & on. Knee does not feel unstable, but she does use caution on stairs. H/o patella fracture last year (usually sees Dr. Magnus Ivan). Will be travelling soon and wanted to make sure the knee was ok.     HPI: She is here with left knee pain.  Last week she hit her knee against a booth at [employer].  This is the same knee that had a patella fracture last year which healed without complication.  She is not in severe pain, but occasionally has some twinges of pain if.  She is getting ready to go on a 3-week cruise on [facility] and wanted to be sure her knee was okay.  She has not taken anything for pain.  She has allergies to ibuprofen but tolerates aspirin.                ROS:   All other systems were reviewed and are negative.  Objective: Vital Signs: LMP 05/09/2012   Physical Exam:  General:  Alert and oriented, in no acute distress. Pulm:  Breathing unlabored. Psy:  Normal mood, congruent affect.  Left knee: No effusion.  2+ patellofemoral crepitus.  No significant pain with palpation of the patella or with patellar compression or apprehension.  Ligaments feel stable.  No joint line tenderness, no pain or click with McMurray's.  Imaging: XR KNEE 3 VIEW LEFT  Result Date: 11/06/2020 Three-view x-rays of the left knee reveal no new fracture.  The previous patella fracture has healed.  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  No loose body seen.   Assessment & Plan: Left knee contusion -Reassurance, I do not see a new  fracture.  Okay to proceed with Cruise as scheduled.  Tylenol or aspirin as needed.  Follow-up as needed.     Procedures: No procedures performed        PMFS History: There are no problems to display for this patient.  Past Medical History:  Diagnosis Date   Focal nodular hyperplasia of liver    Left patella fracture 01/08/2020   no surgical care.   Osteopenia 2020   PCOS (polycystic ovarian syndrome)    Thyroid disease    hypothyroidism    Family History  Problem Relation Age of Onset   Hypertension Mother    Thyroid disease Mother    Hypertension Father    Heart disease Father    Skin cancer Father    Breast cancer Neg Hx     Past Surgical History:  Procedure Laterality Date   gartners duct cyst Right    -vaginal wall   ROOT CANAL  06/05/14   Tooth Capped    Social History   Occupational History   Not on file  Tobacco Use   Smoking status: Never   Smokeless tobacco: Never  Vaping Use   Vaping Use: Never used  Substance and Sexual Activity   Alcohol use: No  Drug use: No   Sexual activity: Not Currently    Partners: Male    Birth control/protection: Post-menopausal

## 2021-01-08 ENCOUNTER — Other Ambulatory Visit: Payer: Self-pay | Admitting: Family Medicine

## 2021-01-08 DIAGNOSIS — Z1231 Encounter for screening mammogram for malignant neoplasm of breast: Secondary | ICD-10-CM

## 2021-05-14 DIAGNOSIS — H60392 Other infective otitis externa, left ear: Secondary | ICD-10-CM | POA: Diagnosis not present

## 2021-05-14 DIAGNOSIS — H6693 Otitis media, unspecified, bilateral: Secondary | ICD-10-CM | POA: Diagnosis not present

## 2021-05-19 DIAGNOSIS — H65493 Other chronic nonsuppurative otitis media, bilateral: Secondary | ICD-10-CM | POA: Diagnosis not present

## 2021-07-13 ENCOUNTER — Ambulatory Visit
Admission: RE | Admit: 2021-07-13 | Discharge: 2021-07-13 | Disposition: A | Discharge status: Home / Self Care | Payer: BC Managed Care – PPO | Source: Ambulatory Visit | Attending: Obstetrics and Gynecology | Admitting: Obstetrics and Gynecology

## 2021-07-13 ENCOUNTER — Ambulatory Visit
Admission: RE | Admit: 2021-07-13 | Discharge: 2021-07-13 | Disposition: A | Discharge status: Home / Self Care | Payer: BC Managed Care – PPO | Source: Ambulatory Visit | Attending: Family Medicine | Admitting: Family Medicine

## 2021-07-13 DIAGNOSIS — Z1231 Encounter for screening mammogram for malignant neoplasm of breast: Secondary | ICD-10-CM

## 2021-07-13 DIAGNOSIS — Z780 Asymptomatic menopausal state: Secondary | ICD-10-CM | POA: Diagnosis not present

## 2021-07-13 DIAGNOSIS — M8589 Other specified disorders of bone density and structure, multiple sites: Secondary | ICD-10-CM | POA: Diagnosis not present

## 2021-07-16 ENCOUNTER — Encounter: Payer: Self-pay | Admitting: Obstetrics and Gynecology

## 2021-08-02 ENCOUNTER — Other Ambulatory Visit: Payer: Self-pay

## 2021-08-02 ENCOUNTER — Encounter: Payer: Self-pay | Admitting: Obstetrics and Gynecology

## 2021-08-02 ENCOUNTER — Ambulatory Visit (INDEPENDENT_AMBULATORY_CARE_PROVIDER_SITE_OTHER): Payer: BC Managed Care – PPO | Admitting: Obstetrics and Gynecology

## 2021-08-02 VITALS — BP 142/84 | HR 72 | Ht 61.5 in | Wt 151.0 lb

## 2021-08-02 DIAGNOSIS — Z01419 Encounter for gynecological examination (general) (routine) without abnormal findings: Secondary | ICD-10-CM | POA: Diagnosis not present

## 2021-08-02 NOTE — Patient Instructions (Signed)

## 2021-08-02 NOTE — Progress Notes (Signed)
66 y.o. G0P0 Single Caucasian female here for annual exam.   ? ?No vaginal bleeding.  ? ?Got Covid on a cruise.  ? ?Got an inner ear infection on another cruise.  ?She saw ENT.  ? ?She declines treatment for osteopenia and osteoporosis.  ? ?She uses [employer] vitamins. ? ?PCP:  Dr. Chanetta Marshall.  ? ?Patient's last menstrual period was 05/09/2012.     ?  ?    ?Sexually active: No.  ?The current method of family planning is post menopausal status.    ?Exercising: No.  The patient does not participate in regular exercise at present. ?Smoker:  no ? ?Health Maintenance: ?Pap:  07-24-18 Neg:Neg HR HPV, 07-15-15 Neg:Neg HR HPV, 06-10-11 Neg:Neg HR HPV ?History of abnormal Pap:  no ?MMG:  07-13-21 Neg/Birads1 ?Colonoscopy:   2010 normal--Doing Ifob yearly with PCP ?BMD:   07-13-21  Result :Osteopenia ?TDaP:  07-20-17 ?Gardasil:   no ?HIV: 07-16-16 Neg w/PCP ?Hep C: 07-16-16 Neg w/PCP ?Screening Labs:  PCP. ? ? reports that she has never smoked. She has never used smokeless tobacco. She reports that she does not drink alcohol and does not use drugs. ? ?Past Medical History:  ?Diagnosis Date  ? Focal nodular hyperplasia of liver   ? Left patella fracture 01/08/2020  ? no surgical care.  ? Osteopenia 2023  ? FRAX:  increased fracture risk  ? PCOS (polycystic ovarian syndrome)   ? Thyroid disease   ? hypothyroidism  ? ? ?Past Surgical History:  ?Procedure Laterality Date  ? gartners duct cyst Right   ? -vaginal wall  ? ROOT CANAL  06/05/14  ? Tooth Capped   ? ? ?Current Outpatient Medications  ?Medication Sig Dispense Refill  ? cholecalciferol (VITAMIN D3) 25 MCG (1000 UNIT) tablet 1 tablet    ? levothyroxine (SYNTHROID) 50 MCG tablet 1 tablet    ? Multiple Vitamin (MULTIVITAMIN) capsule Take 1 capsule by mouth daily.    ? NON FORMULARY Digestive Enzymes    ? polycarbophil (FIBERCON) 625 MG tablet Take 625 mg by mouth daily.    ? Progesterone 40 % CREA by Does not apply route. 2 weeks on, then 2 weeks off    ? ?No current  facility-administered medications for this visit.  ? ? ?Family History  ?Problem Relation Age of Onset  ? Hypertension Mother   ? Thyroid disease Mother   ? Hypertension Father   ? Heart disease Father   ? Skin cancer Father   ? Breast cancer Neg Hx   ? ? ?Review of Systems  ?All other systems reviewed and are negative. ? ?Exam:   ?BP (!) 142/84   Pulse 72   Ht 5' 1.5" (1.562 m)   Wt 151 lb (68.5 kg)   LMP 05/09/2012   SpO2 98%   BMI 28.07 kg/m?     ?General appearance: alert, cooperative and appears stated age ?Head: normocephalic, without obvious abnormality, atraumatic ?Neck: no adenopathy, supple, symmetrical, trachea midline and thyroid normal to inspection and palpation ?Lungs: clear to auscultation bilaterally ?Breasts: normal appearance, no masses or tenderness, No nipple retraction or dimpling, No nipple discharge or bleeding, No axillary adenopathy ?Heart: regular rate and rhythm ?Abdomen: soft, non-tender; no masses, no organomegaly ?Extremities: extremities normal, atraumatic, no cyanosis or edema ?Skin: skin color, texture, turgor normal. No rashes or lesions ?Lymph nodes: cervical, supraclavicular, and axillary nodes normal. ?Neurologic: grossly normal ? ?Pelvic: External genitalia:  no lesions ?  No abnormal inguinal nodes palpated. ?             Urethra:  normal appearing urethra with no masses, tenderness or lesions ?             Bartholins and Skenes: normal    ?             Vagina: normal appearing vagina with normal color and discharge, no lesions ?             Cervix: no lesions ?             Pap taken: No. ?Bimanual Exam:  Uterus:  normal size, contour, position, consistency, mobility, non-tender ?             Adnexa: no mass, fullness, tenderness ?             Rectal exam: Yes.  .  Confirms. ?             Anus:  normal sphincter tone, no lesions ? ?Chaperone was present for exam:  Marchelle Folks, CMA ? ?Assessment:   ?Well woman visit with gynecologic exam. ?Painful pelvic  exams. ?Using progesterone cream ?Liver focal nodular hyperplasia.  Resolved. ?Hypothyroidism. ?Osteopenia. ? ?Plan: ?Mammogram screening discussed. ?Self breast awareness reviewed. ?Pap and HR HPV as above. ?Guidelines for Calcium, Vitamin D, regular exercise program including cardiovascular and weight bearing exercise. ?BMD declined. ?Colon cancer screening options reviewed:  colonoscopy, Cologuard, and IFOB. ?Follow up annually and prn.  ? ?After visit summary provided.  ? ? ? ?

## 2021-08-18 DIAGNOSIS — E785 Hyperlipidemia, unspecified: Secondary | ICD-10-CM | POA: Diagnosis not present

## 2021-08-18 DIAGNOSIS — E559 Vitamin D deficiency, unspecified: Secondary | ICD-10-CM | POA: Diagnosis not present

## 2021-08-18 DIAGNOSIS — E039 Hypothyroidism, unspecified: Secondary | ICD-10-CM | POA: Diagnosis not present

## 2021-08-18 DIAGNOSIS — Z0000 Encounter for general adult medical examination without abnormal findings: Secondary | ICD-10-CM | POA: Diagnosis not present

## 2021-08-18 DIAGNOSIS — Z5181 Encounter for therapeutic drug level monitoring: Secondary | ICD-10-CM | POA: Diagnosis not present

## 2021-08-25 DIAGNOSIS — Z1211 Encounter for screening for malignant neoplasm of colon: Secondary | ICD-10-CM | POA: Diagnosis not present

## 2021-10-06 DIAGNOSIS — H10413 Chronic giant papillary conjunctivitis, bilateral: Secondary | ICD-10-CM | POA: Diagnosis not present

## 2021-10-06 DIAGNOSIS — H2513 Age-related nuclear cataract, bilateral: Secondary | ICD-10-CM | POA: Diagnosis not present

## 2021-10-06 DIAGNOSIS — H5213 Myopia, bilateral: Secondary | ICD-10-CM | POA: Diagnosis not present

## 2021-12-13 DIAGNOSIS — D2361 Other benign neoplasm of skin of right upper limb, including shoulder: Secondary | ICD-10-CM | POA: Diagnosis not present

## 2021-12-13 DIAGNOSIS — D2261 Melanocytic nevi of right upper limb, including shoulder: Secondary | ICD-10-CM | POA: Diagnosis not present

## 2021-12-13 DIAGNOSIS — L918 Other hypertrophic disorders of the skin: Secondary | ICD-10-CM | POA: Diagnosis not present

## 2021-12-13 DIAGNOSIS — D2239 Melanocytic nevi of other parts of face: Secondary | ICD-10-CM | POA: Diagnosis not present

## 2022-05-30 ENCOUNTER — Other Ambulatory Visit: Payer: Self-pay | Admitting: Family Medicine

## 2022-05-30 DIAGNOSIS — Z1231 Encounter for screening mammogram for malignant neoplasm of breast: Secondary | ICD-10-CM

## 2022-07-18 ENCOUNTER — Ambulatory Visit
Admission: RE | Admit: 2022-07-18 | Discharge: 2022-07-18 | Disposition: A | Discharge status: Home / Self Care | Payer: BC Managed Care – PPO | Source: Ambulatory Visit | Attending: Family Medicine | Admitting: Family Medicine

## 2022-07-18 DIAGNOSIS — Z1231 Encounter for screening mammogram for malignant neoplasm of breast: Secondary | ICD-10-CM | POA: Diagnosis not present

## 2022-07-20 LAB — HM MAMMOGRAPHY

## 2022-07-21 NOTE — Progress Notes (Signed)
67 y.o. G0P0 Single Caucasian female here for annual exam.    No vaginal bleeding.   Using progesterone cream.  Went to the ER due to back pain and heart palpitations.  Told she did not have an MI.  Taking muscle relaxer.   PCP:   Dr. Ger[facility]  Patient's last menstrual period was 05/09/2012.           Sexually active: No.  The current method of family planning is post menopausal status.    Exercising: No.     Smoker:  no  Health Maintenance: Pap:   07-24-18 Neg:Neg HR HPV, 07-15-15 Neg:Neg HR HPV, 06-10-11 Neg:Neg HR HPV  History of abnormal Pap:  no MMG:  07/18/22 Breast Density Category B, BI-RADS CATEGORY 1 Neg Colonoscopy:  2010 normal.  Does hemoccult yearly with PCP.  BMD:   07/13/21  Result  osteopenia TDaP:  07/20/17 Gardasil:   no HIV: 07/16/16 neg Hep C: 07/16/16 neg Screening Labs:  PCP Shingrix:  completed.   reports that she has never smoked. She has never used smokeless tobacco. She reports that she does not drink alcohol and does not use drugs.  Past Medical History:  Diagnosis Date   Focal nodular hyperplasia of liver    Left patella fracture 01/08/2020   no surgical care.   Osteopenia 2023   FRAX:  increased fracture risk   PCOS (polycystic ovarian syndrome)    Thyroid disease    hypothyroidism    Past Surgical History:  Procedure Laterality Date   gartners duct cyst Right    -vaginal wall   ROOT CANAL  06/05/14   Tooth Capped     Current Outpatient Medications  Medication Sig Dispense Refill   cholecalciferol (VITAMIN D3) 25 MCG (1000 UNIT) tablet 1 tablet     Estradiol (VAGIFEM) 10 MCG TABS vaginal tablet Place 1 tablet (10 mcg total) vaginally 2 (two) times a week. Use every night before bed for two weeks when you first begin this medicine, then after the first two weeks, begin using it twice a week. 34 tablet 3   levothyroxine (SYNTHROID) 50 MCG tablet 1 tablet     methocarbamol (ROBAXIN) 500 MG tablet Take 1 tablet (500 mg  total) by mouth 2 (two) times daily as needed for muscle spasms. 20 tablet 0   Multiple Vitamin (MULTIVITAMIN) capsule Take 1 capsule by mouth daily.     NON FORMULARY Digestive Enzymes     polycarbophil (FIBERCON) 625 MG tablet Take 625 mg by mouth daily.     Progesterone 40 % CREA by Does not apply route. 2 weeks on, then 2 weeks off     diclofenac Sodium (VOLTAREN) 1 % GEL Apply 4 g topically 2 (two) times daily as needed. (Patient not taking: Reported on 08/04/2022) 4 g 0   No current facility-administered medications for this visit.    Family History  Problem Relation Age of Onset   Hypertension Mother    Thyroid disease Mother    Hypertension Father    Heart disease Father    Skin cancer Father    Breast cancer Neg Hx     Review of Systems  All other systems reviewed and are negative.   Exam:   BP 128/82 (BP Location: Left Arm, Patient Position: Sitting, Cuff Size: Normal)   Pulse 62   Ht 5\' 2"  (1.575 m)   Wt 150 lb (68 kg)   LMP 05/09/2012   SpO2 97%   BMI 27.44 kg/m  General appearance: alert, cooperative and appears stated age Head: normocephalic, without obvious abnormality, atraumatic Neck: no adenopathy, supple, symmetrical, trachea midline and thyroid normal to inspection and palpation Lungs: clear to auscultation bilaterally Breasts: normal appearance, no masses or tenderness, No nipple retraction or dimpling, No nipple discharge or bleeding, No axillary adenopathy Heart: regular rate and rhythm Abdomen: soft, non-tender; no masses, no organomegaly Extremities: extremities normal, atraumatic, no cyanosis or edema Skin: skin color, texture, turgor normal. No rashes or lesions Lymph nodes: cervical, supraclavicular, and axillary nodes normal. Neurologic: grossly normal  Pelvic: External genitalia:  no lesions              No abnormal inguinal nodes palpated.              Urethra:  normal appearing urethra with no masses, tenderness or lesions               Bartholins and Skenes: normal                 Vagina: normal appearing vagina with normal color and discharge, no lesions              Cervix: unable to see.                Pap taken: no Bimanual Exam:  Uterus:  normal size, contour, position, consistency, mobility, non-tender              Adnexa: no mass, fullness, tenderness              Rectal exam: yes.  Confirms.              Anus:  normal sphincter tone, no lesions  Chaperone was present for exam:  Raquel Sarna  Assessment:   Well woman visit with gynecologic exam. Painful pelvic exams. Vaginal atrophy.  Using progesterone cream Liver focal nodular hyperplasia.  Resolved. Hypothyroidism. Osteopenia.  Plan: Mammogram screening discussed.  Recommended yearly.  Self breast awareness reviewed. Pap and HR HPV not indicated.  Guidelines for Calcium, Vitamin D, regular exercise program including cardiovascular and weight bearing exercise. We discussed vaginal estrogen treatment for atrophy:  tablets, cream, and the Estring.   We reviewed risks and benefits.  She will start Vagifem tablets. Instructed in use.   We reviewed that estrogens and progesterones have potential to increase the growth of breast cancers.  She does labs with her PCP.  BMD next year.  Follow up annually and prn.   After visit summary provided.

## 2022-07-29 DIAGNOSIS — H6991 Unspecified Eustachian tube disorder, right ear: Secondary | ICD-10-CM | POA: Diagnosis not present

## 2022-07-31 ENCOUNTER — Emergency Department (HOSPITAL_BASED_OUTPATIENT_CLINIC_OR_DEPARTMENT_OTHER): Payer: BC Managed Care – PPO | Admitting: Radiology

## 2022-07-31 ENCOUNTER — Emergency Department (HOSPITAL_BASED_OUTPATIENT_CLINIC_OR_DEPARTMENT_OTHER): Payer: BC Managed Care – PPO

## 2022-07-31 ENCOUNTER — Other Ambulatory Visit: Payer: Self-pay

## 2022-07-31 ENCOUNTER — Emergency Department (HOSPITAL_BASED_OUTPATIENT_CLINIC_OR_DEPARTMENT_OTHER)
Admission: EM | Admit: 2022-07-31 | Discharge: 2022-07-31 | Disposition: A | Discharge status: Home / Self Care | Payer: BC Managed Care – PPO | Attending: Emergency Medicine | Admitting: Emergency Medicine

## 2022-07-31 ENCOUNTER — Encounter (HOSPITAL_BASED_OUTPATIENT_CLINIC_OR_DEPARTMENT_OTHER): Payer: Self-pay

## 2022-07-31 DIAGNOSIS — M546 Pain in thoracic spine: Secondary | ICD-10-CM

## 2022-07-31 DIAGNOSIS — T148XXA Other injury of unspecified body region, initial encounter: Secondary | ICD-10-CM

## 2022-07-31 DIAGNOSIS — S29012A Strain of muscle and tendon of back wall of thorax, initial encounter: Secondary | ICD-10-CM | POA: Insufficient documentation

## 2022-07-31 DIAGNOSIS — S46912A Strain of unspecified muscle, fascia and tendon at shoulder and upper arm level, left arm, initial encounter: Secondary | ICD-10-CM | POA: Insufficient documentation

## 2022-07-31 DIAGNOSIS — R079 Chest pain, unspecified: Secondary | ICD-10-CM | POA: Diagnosis not present

## 2022-07-31 DIAGNOSIS — X500XXA Overexertion from strenuous movement or load, initial encounter: Secondary | ICD-10-CM | POA: Diagnosis not present

## 2022-07-31 DIAGNOSIS — R0789 Other chest pain: Secondary | ICD-10-CM | POA: Diagnosis not present

## 2022-07-31 DIAGNOSIS — M25512 Pain in left shoulder: Secondary | ICD-10-CM | POA: Diagnosis not present

## 2022-07-31 MED ORDER — LIDOCAINE 5 % EX PTCH
1.0000 | MEDICATED_PATCH | CUTANEOUS | Status: DC
  Administered 2022-07-31: 1 via TRANSDERMAL
  Filled 2022-07-31: qty 1

## 2022-07-31 MED ORDER — METHOCARBAMOL 500 MG PO TABS
500.0000 mg | ORAL_TABLET | Freq: Once | ORAL | Status: AC
  Administered 2022-07-31: 500 mg via ORAL
  Filled 2022-07-31: qty 1

## 2022-07-31 MED ORDER — DICLOFENAC SODIUM 1 % EX GEL: 4.0000 g | Freq: Two times a day (BID) | CUTANEOUS | 0 refills | Status: DC | PRN

## 2022-07-31 MED ORDER — METHOCARBAMOL 500 MG PO TABS: 500.0000 mg | ORAL_TABLET | Freq: Two times a day (BID) | ORAL | 0 refills | Status: DC | PRN

## 2022-07-31 NOTE — ED Notes (Signed)
Patient verbalizes understanding of discharge instructions. Opportunity for questioning and answers were provided. Armband removed by staff, pt discharged from ED. Ambulated out to lobby  

## 2022-07-31 NOTE — ED Provider Notes (Signed)
[facility] Provider Note   CSN: [id number] Arrival date & time: 07/31/22  0105     History  Chief Complaint  Patient presents with   Shoulder Injury    Jillian Martinez is a 67 y.o. female.  With PMH of PCOS, thyroid disease, osteopenia who presents with left shoulder pain and left upper back pain that started after moving her heavy harp around yesterday.   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  She has been having some pain in her left upper back around her shoulder that is worse with movement and certain positions and palpating on the area.  She has had no fevers, no cough, no URI symptoms, no chest pain, no vomiting, no diaphoresis, no difficulty breathing.  She did not take any medicine for the pain.  She was concerned could be a heart attack..  She does not have any history of hypertension, hyperlipidemia, DM, cardiac disease.  She is apologizing on exam and now thinks it silly that she came.  She was just nervous.     Shoulder Injury       Home Medications Prior to Admission medications   Medication Sig Start Date End Date Taking? Authorizing Provider  diclofenac Sodium (VOLTAREN) 1 % GEL Apply 4 g topically 2 (two) times daily as needed. 07/31/22  Yes Elgie Congo, MD  methocarbamol (ROBAXIN) 500 MG tablet Take 1 tablet (500 mg total) by mouth 2 (two) times daily as needed for muscle spasms. 07/31/22  Yes Elgie Congo, MD  cholecalciferol (VITAMIN D3) 25 MCG (1000 UNIT) tablet 1 tablet    [provider]  levothyroxine (SYNTHROID) 50 MCG tablet 1 tablet    [provider]  Multiple Vitamin (MULTIVITAMIN) capsule Take 1 capsule by mouth daily.    [provider]  NON FORMULARY Digestive Enzymes    [provider]  polycarbophil (FIBERCON) 625 MG tablet Take 625 mg by mouth daily.    [provider]  Progesterone 40 % CREA by Does not  apply route. 2 weeks on, then 2 weeks off    [provider]      Allergies    Benzalkonium chloride, Bacitracin-polymyxin b, Other, Ibuprofen, and Neomycin-bacitracin zn-polymyx    Review of Systems   Review of Systems  Physical Exam Updated Vital Signs BP (!) 173/82   Pulse 63   Temp 97.8 F (36.6 C)   Resp 18   Ht 5\' 2"  (1.575 m)   Wt 70.3 kg   LMP 05/09/2012   SpO2 100%   BMI 28.35 kg/m  Physical Exam Constitutional: Alert and oriented.  Slightly anxious but nontoxic, no acute distress, pleasant. Eyes: Conjunctivae are normal. ENT      Head: Normocephalic and atraumatic. Cardiovascular: S1, S2,  Normal and symmetric distal pulses are present in all extremities.Warm and well perfused. Respiratory: Normal respiratory effort. Breath sounds are normal.  O2 sat 100% on room air Gastrointestinal: Soft and nontender.  Musculoskeletal: Left upper extremity neurovascularly intact with full range of motion actively and passively intact of shoulder elbow and hand.  Tenderness to palpation along the left scapular ridge in the left upper thoracic paraspinal region.  No external skin changes. Neurologic: Normal speech and language.  No facial droop.  Moving all extremities equally 5 out of 5 strength.  Sensation grossly intact.  Steady gait.  No gross focal neurologic deficits are appreciated. Skin: Skin is warm, dry and  intact. No rash noted. Psychiatric: slightly anxious  ED Results / Procedures / Treatments   Labs (all labs ordered are listed, but only abnormal results are displayed) Labs Reviewed - No data to display  EKG EKG Interpretation  Date/Time:  Sunday July 31 2022 01:17:52 EDT Ventricular Rate:  64 PR Interval:  207 QRS Duration: 96 QT Interval:  417 QTC Calculation: 431 R Axis:   93 Text Interpretation: Sinus rhythm Right axis deviation No previous ECGs available Confirmed by Arlee Santosuosso ([id number]) on 07/31/2022 1:52:33 AM  Radiology DG Chest  Portable 1 View  Result Date: 07/31/2022 CLINICAL DATA:  Chest pain following heavy lifting, initial encounter EXAM: PORTABLE CHEST 1 VIEW COMPARISON:  None Available. FINDINGS: The heart size and mediastinal contours are within normal limits. Both lungs are clear. The visualized skeletal structures are unremarkable. IMPRESSION: No active disease. Electronically Signed   By: Mark  Lukens M.D.   On: 07/31/2022 02:27   DG Shoulder Left  Result Date: 07/31/2022 CLINICAL DATA:  Left shoulder pain following heavy lifting, initial encounter EXAM: LEFT SHOULDER - 2+ VIEW COMPARISON:  None Available. FINDINGS: No acute fracture or dislocation is seen. Degenerative changes of the glenohumeral joint are noted. Underlying bony thorax is within normal limits. IMPRESSION: Degenerative change without acute abnormality. Electronically Signed   By: Mark  Lukens M.D.   On: 07/31/2022 02:19    Procedures Procedures    Medications Ordered in ED Medications  lidocaine (LIDODERM) 5 % 1 patch (1 patch Transdermal Patch Applied 07/31/22 0251)  methocarbamol (ROBAXIN) tablet 500 mg (500 mg Oral Given 07/31/22 0251)    ED Course/ Medical Decision Making/ A&P   {                             Medical Decision Making  Jillian Martinez is a 66 y.o. female.  With PMH of PCOS, thyroid disease, osteopenia who presents with left shoulder pain and left upper back pain that started after moving her heavy harp around yesterday.    Patient has pain along the left scapular region in the thoracic paraspinal left-sided region.  There are no skin changes concerning for shingles.  She has no adventitious breath sounds, no increased work of breathing, no hypoxia and a chest x-ray which I personally reviewed showing no pneumonia, no pneumothorax, no pleural effusions with normal cardiac size.  Left shoulder x-ray obtained with degenerative change of glenohumeral joint but no fracture or dislocation.  EKG reviewed by me sinus rhythm  with no acute ST/T changes concerning for ischemia.  I am not concerned for ACS, HEART score.  I suspect this is nonspecific musculoskeletal strain.  I did offer patient blood work however she politely declined at this time.  Did discuss strict return precautions and continued supportive care and prescribed Voltaren gel and Robaxin.  Patient in agreement with plan discharged in good condition.    Amount and/or Complexity of Data Reviewed Radiology: ordered.  Risk Prescription drug management.    Final Clinical Impression(s) / ED Diagnoses Final diagnoses:  Acute left-sided thoracic back pain  Muscle strain    Rx / DC Orders ED Discharge Orders          Ordered    methocarbamol (ROBAXIN) 500 MG tablet  2 times daily PRN        03 /24/24 0241    diclofenac Sodium (VOLTAREN) 1 % GEL  2 times daily PRN  07/31/22 [place], MD 07/31/22 [phone]

## 2022-07-31 NOTE — Discharge Instructions (Signed)
You were seen in the emergency department today for back and shoulder pain. You workup did not reveal a definite cause of your symptoms but was generally reassuring.  There was some arthritic changes in your shoulder.  I do suspect you have a muscle strain from moving your harp.  Your x-rays and EKG were reassuring.  Continue using the medications as prescribed or as needed for pain control with Tylenol.  Continue gentle exercises.  Return to the emergency department immediately if you develop recurrent, severe chest pain, shortness of breath, fainting spells, sudden sweatiness, or any other concerning symptoms.   Please also make an appointment to follow up with your primary care doctor within one week-two weeks to assure improvement or resolution in symptoms.

## 2022-07-31 NOTE — ED Triage Notes (Signed)
POV from home, A&O x 4, GCS 15, amb to room  Sts that yesterday she picked up her harp and noticed left shoulder pain. Got better and then today she had to pick it up again and pain came back.

## 2022-08-04 ENCOUNTER — Ambulatory Visit (INDEPENDENT_AMBULATORY_CARE_PROVIDER_SITE_OTHER): Payer: BC Managed Care – PPO | Admitting: Obstetrics and Gynecology

## 2022-08-04 ENCOUNTER — Encounter: Payer: Self-pay | Admitting: Obstetrics and Gynecology

## 2022-08-04 ENCOUNTER — Other Ambulatory Visit: Payer: Self-pay | Admitting: Obstetrics and Gynecology

## 2022-08-04 ENCOUNTER — Telehealth: Payer: Self-pay

## 2022-08-04 VITALS — BP 128/82 | HR 62 | Ht 62.0 in | Wt 150.0 lb

## 2022-08-04 DIAGNOSIS — N952 Postmenopausal atrophic vaginitis: Secondary | ICD-10-CM | POA: Diagnosis not present

## 2022-08-04 DIAGNOSIS — Z01419 Encounter for gynecological examination (general) (routine) without abnormal findings: Secondary | ICD-10-CM

## 2022-08-04 MED ORDER — ESTRADIOL 10 MCG VA TABS: 1.0000 | ORAL_TABLET | VAGINAL | 3 refills | Status: DC

## 2022-08-04 NOTE — Telephone Encounter (Signed)
Patient called in voice mail stating that she wanted to cancel the estrogen Rx that was discussed with Dr. Quincy Simmonds this morning. She said she recalled that she had "liver damage from estrogen in the past" and does not think using this will be good for her.

## 2022-08-04 NOTE — Telephone Encounter (Signed)
Estrogen prescription discontinued.   Encounter closed.

## 2022-08-19 DIAGNOSIS — Z5181 Encounter for therapeutic drug level monitoring: Secondary | ICD-10-CM | POA: Diagnosis not present

## 2022-08-19 DIAGNOSIS — Z0000 Encounter for general adult medical examination without abnormal findings: Secondary | ICD-10-CM | POA: Diagnosis not present

## 2022-08-19 DIAGNOSIS — E559 Vitamin D deficiency, unspecified: Secondary | ICD-10-CM | POA: Diagnosis not present

## 2022-08-19 DIAGNOSIS — E785 Hyperlipidemia, unspecified: Secondary | ICD-10-CM | POA: Diagnosis not present

## 2022-08-19 DIAGNOSIS — R768 Other specified abnormal immunological findings in serum: Secondary | ICD-10-CM | POA: Diagnosis not present

## 2022-08-19 LAB — COMPREHENSIVE METABOLIC PANEL
Albumin: 4 (ref 3.5–5.0)
Calcium: 9.3 (ref 8.7–10.7)
eGFR: 66

## 2022-08-19 LAB — BASIC METABOLIC PANEL
BUN: 19 (ref 4–21)
CO2: 29 — AB (ref 13–22)
Chloride: 106 (ref 99–108)
Creatinine: 1 (ref 0.5–1.1)
Glucose: 92
Potassium: 4.2 meq/L (ref 3.5–5.1)
Sodium: 140 (ref 137–147)

## 2022-08-19 LAB — HEPATIC FUNCTION PANEL
ALT: 17 U/L (ref 7–35)
AST: 19 (ref 13–35)
Alkaline Phosphatase: 80 (ref 25–125)
Bilirubin, Total: 0.6

## 2022-08-19 LAB — LIPID PANEL
Cholesterol: 207 — AB (ref 0–200)
HDL: 68 (ref 35–70)
LDL Cholesterol: 128
LDl/HDL Ratio: 3.1
Triglycerides: 65 (ref 40–160)

## 2022-08-19 LAB — TSH: TSH: 2.85 (ref 0.41–5.90)

## 2022-08-19 LAB — VITAMIN D 25 HYDROXY (VIT D DEFICIENCY, FRACTURES): Vit D, 25-Hydroxy: 34.5

## 2022-08-19 LAB — CBC AND DIFFERENTIAL
HCT: 39 (ref 36–46)
Hemoglobin: 12.8 (ref 12.0–16.0)
Platelets: 286 10*3/uL (ref 150–400)
WBC: 6.5

## 2022-08-19 LAB — CBC: RBC: 4.15 (ref 3.87–5.11)

## 2022-08-25 LAB — HM HEPATITIS C SCREENING LAB: HM Hepatitis Screen: NEGATIVE

## 2022-08-29 DIAGNOSIS — Z1211 Encounter for screening for malignant neoplasm of colon: Secondary | ICD-10-CM | POA: Diagnosis not present

## 2022-09-27 ENCOUNTER — Other Ambulatory Visit (HOSPITAL_BASED_OUTPATIENT_CLINIC_OR_DEPARTMENT_OTHER): Payer: Self-pay | Admitting: Family Medicine

## 2022-09-27 DIAGNOSIS — E785 Hyperlipidemia, unspecified: Secondary | ICD-10-CM

## 2022-09-28 DIAGNOSIS — H16012 Central corneal ulcer, left eye: Secondary | ICD-10-CM | POA: Diagnosis not present

## 2022-09-30 DIAGNOSIS — H16042 Marginal corneal ulcer, left eye: Secondary | ICD-10-CM | POA: Diagnosis not present

## 2022-10-04 DIAGNOSIS — H16042 Marginal corneal ulcer, left eye: Secondary | ICD-10-CM | POA: Diagnosis not present

## 2022-10-07 DIAGNOSIS — H16042 Marginal corneal ulcer, left eye: Secondary | ICD-10-CM | POA: Diagnosis not present

## 2022-10-07 DIAGNOSIS — H5213 Myopia, bilateral: Secondary | ICD-10-CM | POA: Diagnosis not present

## 2022-10-21 DIAGNOSIS — H10413 Chronic giant papillary conjunctivitis, bilateral: Secondary | ICD-10-CM | POA: Diagnosis not present

## 2022-10-21 DIAGNOSIS — H5711 Ocular pain, right eye: Secondary | ICD-10-CM | POA: Diagnosis not present

## 2022-11-09 ENCOUNTER — Ambulatory Visit (HOSPITAL_BASED_OUTPATIENT_CLINIC_OR_DEPARTMENT_OTHER)
Admission: RE | Admit: 2022-11-09 | Discharge: 2022-11-09 | Disposition: A | Discharge status: Home / Self Care | Payer: BC Managed Care – PPO | Source: Ambulatory Visit | Attending: Family Medicine | Admitting: Family Medicine

## 2022-11-09 DIAGNOSIS — E785 Hyperlipidemia, unspecified: Secondary | ICD-10-CM | POA: Insufficient documentation

## 2023-03-21 ENCOUNTER — Encounter: Payer: Self-pay | Admitting: Family Medicine

## 2023-03-21 ENCOUNTER — Ambulatory Visit (INDEPENDENT_AMBULATORY_CARE_PROVIDER_SITE_OTHER): Payer: Medicare Other | Admitting: Family Medicine

## 2023-03-21 VITALS — BP 126/80 | HR 70 | Temp 98.0°F | Resp 16 | Ht 62.0 in | Wt 156.0 lb

## 2023-03-21 DIAGNOSIS — E785 Hyperlipidemia, unspecified: Secondary | ICD-10-CM | POA: Insufficient documentation

## 2023-03-21 DIAGNOSIS — E039 Hypothyroidism, unspecified: Secondary | ICD-10-CM | POA: Insufficient documentation

## 2023-03-21 DIAGNOSIS — M8589 Other specified disorders of bone density and structure, multiple sites: Secondary | ICD-10-CM | POA: Diagnosis not present

## 2023-03-21 DIAGNOSIS — E559 Vitamin D deficiency, unspecified: Secondary | ICD-10-CM | POA: Diagnosis not present

## 2023-03-21 NOTE — Assessment & Plan Note (Signed)
Continue nonpharmacologic treatment. We will plan on rechecking lipid panel next visit.

## 2023-03-21 NOTE — Progress Notes (Signed)
HPI: Jillian Martinez is a 67 y.o. female with a PMHx significant for PCOS, hypothyroidism, HLD, asymptomatic hepatitis B, vitamin D deficiency, and osteopenia, who is here today to establish care.  Former PCP: Jillian Hale, MD Last preventive routine visit: not on file  Patient mentions she sees an alternative medicine provider regularly and takes various supplements.   Exercise: She doesn't exercise regularly.  Sleep: She reports she is having some recent sleep problems.  Alcohol Use: She says she doesn't drink.  Smoking: never Vision: UTD on routine vision care.  Dental: UTD on routine dental care.   Chronic medical problems:   Hypothyroidism:  Currently on levothyroxine 50 mcg daily.  08/19/22 TSH 2.8.  Hyperlipidemia:  She is not currently on pharmacologic treatment.  She is taking various OTC supplements recommended by homeopathic provider she sees regularly. Lab Results  Component Value Date   CHOL 207 (A) 08/19/2022   HDL 68 08/19/2022   LDLCALC 128 08/19/2022   TRIG 65 08/19/2022  Non-HDL 140.  DEXA in 07/2021, osteopenia with a T-score of -2.3 right hip. Major Osteoporotic Fracture: 31.6% Hip Fracture:                2.8% Currently she is on calcium and vitamin D supplementation. She is not on pharmacologic treatment for osteoporosis. Last 25 OH vitamin D was 34.5 in 08/2022.  She has no concerns today.   Review of Systems  Constitutional:  Negative for activity change, appetite change, fatigue, fever and unexpected weight change.  HENT:  Negative for mouth sores, nosebleeds and trouble swallowing.   Eyes:  Negative for redness and visual disturbance.  Respiratory:  Negative for cough, shortness of breath and wheezing.   Cardiovascular:  Negative for chest pain, palpitations and leg swelling.  Gastrointestinal:  Negative for abdominal pain, nausea and vomiting.       Negative for changes in bowel habits.  Genitourinary:  Negative for decreased  urine volume, difficulty urinating, dysuria and hematuria.  Neurological:  Negative for syncope, weakness and headaches.  See other pertinent positives and negatives in HPI.  Current Outpatient Medications on File Prior to Visit  Medication Sig Dispense Refill   levothyroxine (SYNTHROID) 50 MCG tablet 1 tablet     Multiple Vitamin (MULTIVITAMIN) capsule Take 1 capsule by mouth daily.     NON FORMULARY Cleanse More - one tab daily.     NON FORMULARY Proactazynie - one tab daily.     NON FORMULARY Bifidophilus Insurance claims handler - one tab daily.     No current facility-administered medications on file prior to visit.    Past Medical History:  Diagnosis Date   Focal nodular hyperplasia of liver    Left patella fracture 01/08/2020   no surgical care.   Osteopenia 2023   FRAX:  increased fracture risk   PCOS (polycystic ovarian syndrome)    Thyroid disease    hypothyroidism   Allergies  Allergen Reactions   Benzalkonium Chloride     Other reaction(s): Blisters   Bacitracin-Polymyxin B Other (See Comments)   Other Other (See Comments)   Ibuprofen Rash and Other (See Comments)   Neomycin-Bacitracin Zn-Polymyx Rash    Family History  Problem Relation Age of Onset   Hypertension Mother    Thyroid disease Mother    Hypertension Father    Heart disease Father    Skin cancer Father    Breast cancer Neg Hx     Social History   Socioeconomic History  Marital status: Single    Spouse name: Not on file   Number of children: Not on file   Years of education: Not on file   Highest education level: Not on file  Occupational History   Not on file  Tobacco Use   Smoking status: Never   Smokeless tobacco: Never  Vaping Use   Vaping status: Never Used  Substance and Sexual Activity   Alcohol use: No   Drug use: No   Sexual activity: Not Currently    Partners: Male    Birth control/protection: Post-menopausal  Other Topics Concern   Not on file  Social History Narrative   Not  on file   Social Determinants of Health   Financial Resource Strain: Not on file  Food Insecurity: Not on file  Transportation Needs: Not on file  Physical Activity: Not on file  Stress: Not on file  Social Connections: Not on file    Vitals:   03/21/23 0853  BP: 126/80  Pulse: 70  Resp: 16  Temp: 98 F (36.7 C)  SpO2: 98%    Body mass index is 28.53 kg/m.  Physical Exam Vitals and nursing note reviewed.  Constitutional:      General: She is not in acute distress.    Appearance: She is well-developed.  HENT:     Head: Normocephalic and atraumatic.     Mouth/Throat:     Mouth: Mucous membranes are moist.     Pharynx: Oropharynx is clear.  Eyes:     Conjunctiva/sclera: Conjunctivae normal.  Neck:     Thyroid: No thyroid mass.  Cardiovascular:     Rate and Rhythm: Normal rate and regular rhythm.     Pulses:          Dorsalis pedis pulses are 2+ on the right side and 2+ on the left side.     Heart sounds: No murmur heard. Pulmonary:     Effort: Pulmonary effort is normal. No respiratory distress.     Breath sounds: Normal breath sounds.  Abdominal:     Palpations: Abdomen is soft. There is no hepatomegaly or mass.     Tenderness: There is no abdominal tenderness.  Musculoskeletal:     Right lower leg: No edema.     Left lower leg: No edema.  Lymphadenopathy:     Cervical: No cervical adenopathy.  Skin:    General: Skin is warm.     Findings: No erythema or rash.  Neurological:     General: No focal deficit present.     Mental Status: She is alert and oriented to person, place, and time.     Cranial Nerves: No cranial nerve deficit.     Gait: Gait normal.  Psychiatric:        Mood and Affect: Mood and affect normal.   ASSESSMENT AND PLAN:  Ms. Jillian Martinez was seen today to establish care.   Hyperlipidemia, unspecified hyperlipidemia type Assessment & Plan: Continue nonpharmacologic treatment. We will plan on rechecking lipid panel next  visit.   Osteopenia of multiple sites Assessment & Plan: Will review results of last DEXA. She is not interested in pharmacologic treatment at this time. Continue adequate calcium and vitamin D supplementation as well as fall prevention. Regular exercise recommended, including weightbearing exercises 3 times per week.   Vitamin D deficiency, unspecified Assessment & Plan: Last 58 OH vit D was 34.5 in 08/2022. Continue current dose of vitamin D supplementation.   Hypothyroidism (acquired) Assessment & Plan: Problem is well  controlled, last TSH 08/2022 was 2.8. Continue Levothyroxine 50 cg daily.   Return in about 6 months (around 09/18/2023) for CPE.  I, Suanne Marker, acting as a scribe for General Wearing [place], MD., have documented all relevant documentation on the behalf of Timothey Dahlstrom [place], MD, as directed by  Aniesa Boback [place], MD while in the presence of Ilyaas Musto [place], MD.   I, Alizey Noren [place], MD, have reviewed all documentation for this visit. The documentation on 03/25/23 for the exam, diagnosis, procedures, and orders are all accurate and complete.  Avelino Herren G. [place], MD  [facility]. [place] office.

## 2023-03-21 NOTE — Assessment & Plan Note (Signed)
Will review results of last DEXA. She is not interested in pharmacologic treatment at this time. Continue adequate calcium and vitamin D supplementation as well as fall prevention. Regular exercise recommended, including weightbearing exercises 3 times per week.

## 2023-03-21 NOTE — Assessment & Plan Note (Addendum)
Last 25 OH vit D was 34.5 in 08/2022. Continue current dose of vitamin D supplementation.

## 2023-03-21 NOTE — Patient Instructions (Addendum)
A few things to remember from today's visit:  Hyperlipidemia, unspecified hyperlipidemia type  Osteopenia of multiple sites  Vitamin D deficiency, unspecified No changes today.  If you need refills for medications you take chronically, please call your pharmacy. Do not use My Chart to request refills or for acute issues that need immediate attention. If you send a my chart message, it may take a few days to be addressed, specially if I am not in the office.  Please be sure medication list is accurate. If a new problem present, please set up appointment sooner than planned today.

## 2023-03-25 NOTE — Assessment & Plan Note (Signed)
Problem is well controlled, last TSH 08/2022 was 2.8. Continue Levothyroxine 50 cg daily.

## 2023-05-30 ENCOUNTER — Other Ambulatory Visit: Payer: Self-pay | Admitting: Obstetrics and Gynecology

## 2023-05-30 DIAGNOSIS — Z1231 Encounter for screening mammogram for malignant neoplasm of breast: Secondary | ICD-10-CM

## 2023-06-06 ENCOUNTER — Other Ambulatory Visit: Payer: Self-pay | Admitting: Family Medicine

## 2023-06-26 NOTE — Progress Notes (Signed)
 68 y.o. G0P0 Single Caucasian female here for annual exam.    Followed for vaginal atrophy and osteopenia.  Usig OTC progesterone cream and an OTC vaginal DHEA.   Not using vaginal estrogens.   Last BMD 07/13/21 showed osteopenia of the right hip with T score -2.3 and spine -2.1. FRAX 31.6%/2.8%.  Taking vitamin D.   Declines a pap.  Not sexually active.   PCP: [place], Betty G, MD   Patient's last menstrual period was 05/09/2012.           Sexually active: No.  The current method of family planning is post menopausal status.    Menopausal hormone therapy:  n/a Exercising: No.   Smoker:  no  OB History  Gravida Para Term Preterm AB Living  0     0  SAB IAB Ectopic Multiple Live Births           HEALTH MAINTENANCE: Last 2 paps:  07/24/18 neg: HR HPV neg, 07-15-15 Neg:Neg HR HPV  History of abnormal Pap or positive HPV:  no Mammogram: scheduled next week,  07/20/22 Breast Density cat B, BI-RADS CAT 1 neg Colonoscopy:  05/16/08 - does yearly stool card kit.  Bone Density:  07/13/21  Result  osteopenia   Immunization History  Administered Date(s) Administered   PFIZER(Purple Top)SARS-COV-2 Vaccination 08/01/2019, 08/22/2019, 04/12/2020   Tdap 05/10/2007, 07/20/2017   Zoster Recombinant(Shingrix) 09/16/2021, 11/18/2021      reports that she has never smoked. She has never used smokeless tobacco. She reports that she does not drink alcohol and does not use drugs.  Past Medical History:  Diagnosis Date   Focal nodular hyperplasia of liver    Left patella fracture 01/08/2020   no surgical care.   Osteopenia 2023   FRAX:  increased fracture risk   PCOS (polycystic ovarian syndrome)    Thyroid disease    hypothyroidism    Past Surgical History:  Procedure Laterality Date   gartners duct cyst Right    -vaginal wall   ROOT CANAL  06/05/14   Tooth Capped     Current Outpatient Medications  Medication Sig Dispense Refill   cholecalciferol (VITAMIN D3) 25 MCG (1000  UNIT) tablet Take 2,000 Units by mouth daily.     levothyroxine (SYNTHROID) 50 MCG tablet TAKE 1 TABLET BY MOUTH EVERY DAY 90 tablet 1   Multiple Vitamin (MULTIVITAMIN) capsule Take 1 capsule by mouth daily.     NON FORMULARY Cleanse More - one tab daily.     NON FORMULARY Proactazynie - one tab daily.     NON FORMULARY Bifidophilus Insurance claims handler - one tab daily.     No current facility-administered medications for this visit.    ALLERGIES: Benzalkonium chloride, Bacitracin-polymyxin b, Other, Ibuprofen, and Neomycin-bacitracin zn-polymyx  Family History  Problem Relation Age of Onset   Hypertension Mother    Thyroid disease Mother    Hypertension Father    Heart disease Father    Skin cancer Father    Breast cancer Neg Hx     Review of Systems  All other systems reviewed and are negative.   PHYSICAL EXAM:  BP 134/82 (BP Location: Left Arm, Patient Position: Sitting, Cuff Size: Small)   Pulse 68   Ht 5\' 2"  (1.575 m)   Wt 154 lb (69.9 kg)   LMP 05/09/2012   SpO2 98%   BMI 28.17 kg/m     General appearance: alert, cooperative and appears stated age Head: normocephalic, without obvious abnormality, atraumatic Neck: no  adenopathy, supple, symmetrical, trachea midline and thyroid normal to inspection and palpation Lungs: clear to auscultation bilaterally Breasts: normal appearance, no masses or tenderness, No nipple retraction or dimpling, No nipple discharge or bleeding, No axillary adenopathy Heart: regular rate and rhythm Abdomen: soft, non-tender; no masses, no organomegaly Extremities: extremities normal, atraumatic, no cyanosis or edema Skin: skin color, texture, turgor normal. No rashes or lesions Lymph nodes: cervical, supraclavicular, and axillary nodes normal. Neurologic: grossly normal  Pelvic: External genitalia:  no lesions              No abnormal inguinal nodes palpated.              Urethra:  normal appearing urethra with no masses, tenderness or lesions               Bartholins and Skenes: normal                 Vagina: normal appearing vagina with normal color and discharge, no lesions              Cervix: no lesions              Pap taken: No. Bimanual Exam:  Uterus:  normal size, contour, position, consistency, mobility, non-tender.               Adnexa: no mass, fullness, tenderness  Pelvic exam is uncomfortable for the patient.                Rectal exam: Yes.  .  Confirms.              Anus:  normal sphincter tone, no lesions  Chaperone was present for exam:  Warren Lacy, CMA  ASSESSMENT: Well woman visit with gynecologic exam. Painful pelvic exams. Vaginal atrophy.  Using progesterone cream. Liver focal nodular hyperplasia.  Resolved. Hypothyroidism. Osteopenia.  PLAN: Mammogram screening discussed. Self breast awareness reviewed. Pap and HRV collected:  no.  Declines.  Guidelines for Calcium, Vitamin D, regular exercise program including cardiovascular and weight bearing exercise. Medication refills:  NA We reviewed vaginal estradiol and vaginal DHEA and potential effect on nodular hyperplasia.  We discussed potential compounded vaginal vit E.  She will consider this. Labs with PCP. [facility]. Follow up:  2 years and prn.     Additional counseling given.  yes. 25 min  total time was spent for this patient encounter, including preparation, face-to-face counseling with the patient, coordination of care, and documentation of the encounter regarding osteopenia and treatment for vaginal atrophyin addition to doing the breast and pelvic exam.

## 2023-07-10 ENCOUNTER — Encounter: Payer: Self-pay | Admitting: Obstetrics and Gynecology

## 2023-07-10 ENCOUNTER — Ambulatory Visit (INDEPENDENT_AMBULATORY_CARE_PROVIDER_SITE_OTHER): Payer: BC Managed Care – PPO | Admitting: Obstetrics and Gynecology

## 2023-07-10 VITALS — BP 134/82 | HR 68 | Ht 62.0 in | Wt 154.0 lb

## 2023-07-10 DIAGNOSIS — M8589 Other specified disorders of bone density and structure, multiple sites: Secondary | ICD-10-CM

## 2023-07-10 DIAGNOSIS — Z780 Asymptomatic menopausal state: Secondary | ICD-10-CM | POA: Diagnosis not present

## 2023-07-10 DIAGNOSIS — Z01419 Encounter for gynecological examination (general) (routine) without abnormal findings: Secondary | ICD-10-CM

## 2023-07-10 NOTE — Patient Instructions (Signed)

## 2023-07-12 ENCOUNTER — Other Ambulatory Visit: Payer: Self-pay | Admitting: Obstetrics and Gynecology

## 2023-07-12 DIAGNOSIS — Z780 Asymptomatic menopausal state: Secondary | ICD-10-CM

## 2023-07-19 ENCOUNTER — Ambulatory Visit: Payer: Medicare Other

## 2023-07-19 ENCOUNTER — Ambulatory Visit
Admission: RE | Admit: 2023-07-19 | Discharge: 2023-07-19 | Disposition: A | Discharge status: Home / Self Care | Payer: Medicare Other | Source: Ambulatory Visit | Attending: Obstetrics and Gynecology | Admitting: Obstetrics and Gynecology

## 2023-07-19 DIAGNOSIS — Z1231 Encounter for screening mammogram for malignant neoplasm of breast: Secondary | ICD-10-CM

## 2023-07-24 ENCOUNTER — Encounter: Payer: Self-pay | Admitting: Obstetrics and Gynecology

## 2023-08-07 NOTE — Progress Notes (Signed)
 HPI: Jillian Martinez is a 68 y.o. female with a PMHx significant for PCOS, hypothyroidism, HLD, asymptomatic hepatitis B, vitamin D deficiency, and osteopenia, who is here today for her routine physical.  Last CPE: 08/19/2022 with Dr. Garth Bigness States that she has changed health insurance and would like a CPE today.  Exercise: Patient admits she has not been exercising regularly.  Diet: She does a mix of eating out and cooking at home, states that eats out frequently. Eats vegetables daily.  Sleep: ~6.5 hours per night.  Alcohol Use: none Smoking: never Vision: UTD on routine vision care.  Dental: UTD on routine dental care.   Immunization History  Administered Date(s) Administered   PFIZER(Purple Top)SARS-COV-2 Vaccination 08/01/2019, 08/22/2019, 04/12/2020   Tdap 05/10/2007, 07/20/2017   Zoster Recombinant(Shingrix) 09/16/2021, 11/18/2021   Health Maintenance  Topic Date Due   Medicare Annual Wellness (AWV)  Never done   Colonoscopy  05/16/2018   COVID-19 Vaccine (4 - 2024-25 season) 08/24/2023 (Originally 01/08/2023)   Pneumonia Vaccine 29+ Years old (1 of 1 - PCV) 03/20/2024 (Originally 01/03/2023)   Cervical Cancer Screening (HPV/Pap Cotest)  08/07/2024 (Originally 07/23/2021)   HIV Screening  08/07/2024 (Originally 01/02/1971)   COLON CANCER SCREENING ANNUAL FOBT  09/01/2023   INFLUENZA VACCINE  12/08/2023   MAMMOGRAM  07/18/2025   DTaP/Tdap/Td (3 - Td or Tdap) 07/21/2027   DEXA SCAN  Completed   Hepatitis C Screening  Completed   Zoster Vaccines- Shingrix  Completed   HPV VACCINES  Aged Out   Chronic medical problems:   Hyperlipidemia: Not currently on pharmacologic treatment.  Lab Results  Component Value Date   CHOL 207 (A) 08/19/2022   HDL 68 08/19/2022   LDLCALC 128 08/19/2022   TRIG 65 08/19/2022   Hypothyroidism:  Currently on levothyroxine 50 mcg daily.  Last TSH normal at 2.8 in 08/2022.  Vitamin D deficiency/osteopenia:  Currently on  cholecalciferol 2000 units daily and a multivitamin.  Her last DEXA scan was in 07/2021, ordered by her gynecologist. Osteopenia with a T-score of -2.3 right hip. Major Osteoporotic Fracture: 31.6% Hip Fracture:                2.8%  BP elevated today, no history of hypertension. She states that yesterday she ate [place] food, which was salty.  No new concerns or problems today.   Review of Systems  Constitutional:  Negative for activity change, appetite change and fever.  HENT:  Negative for mouth sores, sore throat and trouble swallowing.   Eyes:  Negative for redness and visual disturbance.  Respiratory:  Negative for cough, shortness of breath and wheezing.   Cardiovascular:  Negative for chest pain and leg swelling.  Gastrointestinal:  Negative for abdominal pain, nausea and vomiting.       No changes in bowel habits.  Endocrine: Negative for cold intolerance, heat intolerance, polydipsia, polyphagia and polyuria.  Genitourinary:  Negative for decreased urine volume, dysuria, hematuria, vaginal bleeding and vaginal discharge.  Musculoskeletal:  Negative for gait problem and myalgias.  Skin:  Negative for color change and rash.  Allergic/Immunologic: Negative for environmental allergies.  Neurological:  Negative for seizures, syncope, weakness and headaches.  Hematological:  Negative for adenopathy. Does not bruise/bleed easily.  Psychiatric/Behavioral:  Negative for confusion. The patient is not nervous/anxious.   All other systems reviewed and are negative.  Current Outpatient Medications on File Prior to Visit  Medication Sig Dispense Refill   cholecalciferol (VITAMIN D3) 25 MCG (1000 UNIT) tablet Take  2,000 Units by mouth daily.     levothyroxine (SYNTHROID) 50 MCG tablet TAKE 1 TABLET BY MOUTH EVERY DAY 90 tablet 1   Multiple Vitamin (MULTIVITAMIN) capsule Take 1 capsule by mouth daily.     NON FORMULARY Cleanse More - one tab daily.     NON FORMULARY Proactazynie - one tab  daily.     NON FORMULARY Bifidophilus Insurance claims handler - one tab daily.     No current facility-administered medications on file prior to visit.   Past Medical History:  Diagnosis Date   Focal nodular hyperplasia of liver    Left patella fracture 01/08/2020   no surgical care.   Osteopenia 2023   FRAX:  increased fracture risk   PCOS (polycystic ovarian syndrome)    Thyroid disease    hypothyroidism   Past Surgical History:  Procedure Laterality Date   gartners duct cyst Right    -vaginal wall   ROOT CANAL  06/05/14   Tooth Capped    Allergies  Allergen Reactions   Benzalkonium Chloride     Other reaction(s): Blisters   Bacitracin-Polymyxin B Other (See Comments)   Other Other (See Comments)   Ibuprofen Rash and Other (See Comments)   Neomycin-Bacitracin Zn-Polymyx Rash   Family History  Problem Relation Age of Onset   Hypertension Mother    Thyroid disease Mother    Hypertension Father    Heart disease Father    Skin cancer Father    Breast cancer Neg Hx    Social History   Socioeconomic History   Marital status: Single    Spouse name: Not on file   Number of children: Not on file   Years of education: Not on file   Highest education level: Master's degree (e.g., MA, MS, MEng, MEd, MSW, MBA)  Occupational History   Not on file  Tobacco Use   Smoking status: Never   Smokeless tobacco: Never  Vaping Use   Vaping status: Never Used  Substance and Sexual Activity   Alcohol use: No   Drug use: No   Sexual activity: Not Currently    Partners: Male    Birth control/protection: Post-menopausal    Comment: less than 5, IC after 16, no STD, no abnormal pap, no DES  Other Topics Concern   Not on file  Social History Narrative   Not on file   Social Drivers of Health   Financial Resource Strain: Low Risk  (08/08/2023)   Overall Financial Resource Strain (CARDIA)    Difficulty of Paying Living Expenses: Not hard at all  Food Insecurity: No Food Insecurity  (08/08/2023)   Hunger Vital Sign    Worried About Running Out of Food in the Last Year: Never true    Ran Out of Food in the Last Year: Never true  Transportation Needs: No Transportation Needs (08/08/2023)   PRAPARE - Administrator, Civil Service (Medical): No    Lack of Transportation (Non-Medical): No  Physical Activity: Unknown (08/08/2023)   Exercise Vital Sign    Days of Exercise per Week: 0 days    Minutes of Exercise per Session: Not on file  Stress: Stress Concern Present (08/08/2023)   [employer] of Occupational Health - Occupational Stress Questionnaire    Feeling of Stress : To some extent  Social Connections: Moderately Integrated (08/08/2023)   Social Connection and Isolation Panel [NHANES]    Frequency of Communication with Friends and Family: More than three times a week  Frequency of Social Gatherings with Friends and Family: More than three times a week    Attends Religious Services: More than 4 times per year    Active Member of Clubs or Organizations: Yes    Attends Banker Meetings: More than 4 times per year    Marital Status: Never married   Vitals:   08/08/23 0809 08/08/23 0830  BP: (!) 130/90 (!) 155/90  Pulse: 64   Resp: 16   Temp: 98.1 F (36.7 C)   SpO2: 98%    Body mass index is 28.53 kg/m.  Wt Readings from Last 3 Encounters:  08/08/23 156 lb (70.8 kg)  07/10/23 154 lb (69.9 kg)  03/21/23 156 lb (70.8 kg)   Physical Exam Vitals and nursing note reviewed.  Constitutional:      General: She is not in acute distress.    Appearance: She is well-developed.  HENT:     Head: Normocephalic and atraumatic.     Right Ear: Tympanic membrane, ear canal and external ear normal.     Left Ear: Tympanic membrane, ear canal and external ear normal.     Mouth/Throat:     Mouth: Mucous membranes are moist.     Pharynx: Oropharynx is clear. Uvula midline.  Eyes:     Extraocular Movements: Extraocular movements intact.      Conjunctiva/sclera: Conjunctivae normal.     Pupils: Pupils are equal, round, and reactive to light.  Neck:     Thyroid: No thyroid mass or thyromegaly.  Cardiovascular:     Rate and Rhythm: Normal rate and regular rhythm.     Pulses:          Dorsalis pedis pulses are 2+ on the right side and 2+ on the left side.     Heart sounds: No murmur heard. Pulmonary:     Effort: Pulmonary effort is normal. No respiratory distress.     Breath sounds: Normal breath sounds.  Abdominal:     Palpations: Abdomen is soft. There is no hepatomegaly or mass.     Tenderness: There is no abdominal tenderness.  Genitourinary:    Comments: No concerns today. Musculoskeletal:     Right lower leg: No edema.     Left lower leg: No edema.     Comments: No major deformity or signs of synovitis appreciated.  Lymphadenopathy:     Cervical: No cervical adenopathy.     Upper Body:     Right upper body: No supraclavicular adenopathy.     Left upper body: No supraclavicular adenopathy.  Skin:    General: Skin is warm.     Findings: No erythema or rash.  Neurological:     General: No focal deficit present.     Mental Status: She is alert and oriented to person, place, and time.     Cranial Nerves: No cranial nerve deficit.     Sensory: No sensory deficit.     Motor: No weakness.     Coordination: Coordination normal.     Gait: Gait normal.     Deep Tendon Reflexes:     Reflex Scores:      Bicep reflexes are 2+ on the right side and 2+ on the left side.      Patellar reflexes are 2+ on the right side and 2+ on the left side. Psychiatric:        Mood and Affect: Mood and affect normal.   ASSESSMENT AND PLAN:  Jillian Martinez was here today  for her annual physical examination.  Orders Placed This Encounter  Procedures   Comprehensive metabolic panel with GFR   T4, free   TSH   Lipid panel   VITAMIN D 25 Hydroxy (Vit-D Deficiency, Fractures)   CBC   Lab Results  Component Value Date   TSH  2.58 08/08/2023   Lab Results  Component Value Date   NA 140 08/08/2023   CL 105 08/08/2023   K 3.6 08/08/2023   CO2 26 08/08/2023   BUN 14 08/08/2023   CREATININE 0.89 08/08/2023   GFR 67.95 08/08/2023   CALCIUM 9.0 08/08/2023   ALBUMIN 4.0 08/08/2023   GLUCOSE 91 08/08/2023   Lab Results  Component Value Date   ALT 19 08/08/2023   AST 21 08/08/2023   ALKPHOS 83 08/08/2023   BILITOT 0.7 08/08/2023   Lab Results  Component Value Date   CHOL 205 (H) 08/08/2023   HDL 63.80 08/08/2023   LDLCALC 124 (H) 08/08/2023   TRIG 86.0 08/08/2023   CHOLHDL 3 08/08/2023   Lab Results  Component Value Date   WBC 7.1 08/08/2023   HGB 13.1 08/08/2023   HCT 39.6 08/08/2023   MCV 94.0 08/08/2023   PLT 296.0 08/08/2023   Lab Results  Component Value Date   VD25OH 43.49 08/08/2023   Routine general medical examination at a health care facility We discussed the importance of regular physical activity and healthy diet for prevention of chronic illness and/or complications. Preventive guidelines reviewed. Vaccination updated today, Prevnar 20 given. Ca++ and vit D supplementation to continue. Colon cancer screening: FIT cards given. Next CPE in a year.  Hypothyroidism (acquired) Assessment & Plan: Problem is well controlled, last TSH 08/2022 was 2.8. Continue Levothyroxine 50 cg daily. Further recommendations according to TSH result.  Orders: -     T4, free; Future -     TSH; Future  Hyperlipidemia, unspecified hyperlipidemia type Assessment & Plan: Non pharmacologic treatment recommended for now. Further recommendations will be given according to 10 years CVD risk score and lipid panel numbers.  Orders: -     Comprehensive metabolic panel with GFR; Future -     Lipid panel; Future  Vitamin D deficiency, unspecified Assessment & Plan: Continue vit D 2000 U daily. Further recommendations according to 25 OH vit D result.  Orders: -     VITAMIN D 25 Hydroxy (Vit-D  Deficiency, Fractures); Future  Elevated blood pressure reading Re-checked 155/90. Recommend monitoring BP daily for the next 2 weeks and to let me know about BP readings. Further recommendation will be given according to lab results.  -     CBC; Future  I spent a total of 40 minutes in both face to face and non face to face activities for this visit on the date of this encounter. During this time history was obtained and documented, examination was performed, prior labs/imaging reviewed, and assessment/plan discussed.  Return in 1 year (on 08/07/2024) for CPE, chronic problems, Labs.  I, Rolla Etienne Wierda, acting as a scribe for [place] Giammona [place], MD., have documented all relevant documentation on the behalf of Jakaila Norment [place], MD, as directed by  Keenya Matera [place], MD while in the presence of Trevion Hoben [place], MD.   I, Beth Spackman [place], MD, have reviewed all documentation for this visit. The documentation on 08/08/23 for the exam, diagnosis, procedures, and orders are all accurate and complete.  Amos Gaber G. [place], MD  [facility]. [place] office.

## 2023-08-08 ENCOUNTER — Encounter: Payer: Self-pay | Admitting: Family Medicine

## 2023-08-08 ENCOUNTER — Ambulatory Visit (INDEPENDENT_AMBULATORY_CARE_PROVIDER_SITE_OTHER): Payer: Medicare Other | Admitting: Family Medicine

## 2023-08-08 VITALS — BP 155/90 | HR 64 | Temp 98.1°F | Resp 16 | Ht 62.0 in | Wt 156.0 lb

## 2023-08-08 DIAGNOSIS — R030 Elevated blood-pressure reading, without diagnosis of hypertension: Secondary | ICD-10-CM

## 2023-08-08 DIAGNOSIS — E785 Hyperlipidemia, unspecified: Secondary | ICD-10-CM

## 2023-08-08 DIAGNOSIS — E039 Hypothyroidism, unspecified: Secondary | ICD-10-CM

## 2023-08-08 DIAGNOSIS — Z23 Encounter for immunization: Secondary | ICD-10-CM

## 2023-08-08 DIAGNOSIS — Z0000 Encounter for general adult medical examination without abnormal findings: Secondary | ICD-10-CM

## 2023-08-08 DIAGNOSIS — E559 Vitamin D deficiency, unspecified: Secondary | ICD-10-CM | POA: Diagnosis not present

## 2023-08-08 LAB — CBC
HCT: 39.6 % (ref 36.0–46.0)
Hemoglobin: 13.1 g/dL (ref 12.0–15.0)
MCHC: 33.1 g/dL (ref 30.0–36.0)
MCV: 94 fl (ref 78.0–100.0)
Platelets: 296 10*3/uL (ref 150.0–400.0)
RBC: 4.22 Mil/uL (ref 3.87–5.11)
RDW: 13.8 % (ref 11.5–15.5)
WBC: 7.1 10*3/uL (ref 4.0–10.5)

## 2023-08-08 LAB — T4, FREE: Free T4: 1.25 ng/dL (ref 0.60–1.60)

## 2023-08-08 LAB — LIPID PANEL
Cholesterol: 205 mg/dL — ABNORMAL HIGH (ref 0–200)
HDL: 63.8 mg/dL (ref >39.00)
LDL Cholesterol: 124 mg/dL — ABNORMAL HIGH (ref 0–99)
NonHDL: 141.08
Total CHOL/HDL Ratio: 3
Triglycerides: 86 mg/dL (ref 0.0–149.0)
VLDL: 17.2 mg/dL (ref 0.0–40.0)

## 2023-08-08 LAB — COMPREHENSIVE METABOLIC PANEL WITH GFR
ALT: 19 U/L (ref 0–35)
AST: 21 U/L (ref 0–37)
Albumin: 4 g/dL (ref 3.5–5.2)
Alkaline Phosphatase: 83 U/L (ref 39–117)
BUN: 14 mg/dL (ref 6–23)
CO2: 26 meq/L (ref 19–32)
Calcium: 9 mg/dL (ref 8.4–10.5)
Chloride: 105 meq/L (ref 96–112)
Creatinine, Ser: 0.89 mg/dL (ref 0.40–1.20)
GFR: 67.95 mL/min (ref >60.00)
Glucose, Bld: 91 mg/dL (ref 70–99)
Potassium: 3.6 meq/L (ref 3.5–5.1)
Sodium: 140 meq/L (ref 135–145)
Total Bilirubin: 0.7 mg/dL (ref 0.2–1.2)
Total Protein: 7 g/dL (ref 6.0–8.3)

## 2023-08-08 LAB — TSH: TSH: 2.58 u[IU]/mL (ref 0.35–5.50)

## 2023-08-08 LAB — VITAMIN D 25 HYDROXY (VIT D DEFICIENCY, FRACTURES): VITD: 43.49 ng/mL (ref 30.00–100.00)

## 2023-08-08 MED ORDER — LEVOTHYROXINE SODIUM 50 MCG PO TABS: 50.0000 ug | ORAL_TABLET | Freq: Every day | ORAL | 3 refills | Status: AC

## 2023-08-08 NOTE — Assessment & Plan Note (Signed)
 Problem is well controlled, last TSH 08/2022 was 2.8. Continue Levothyroxine 50 cg daily. Further recommendations according to TSH result.

## 2023-08-08 NOTE — Patient Instructions (Addendum)
 A few things to remember from today's visit:  Routine general medical examination at a health care facility  Hypothyroidism (acquired) - Plan: T4, free, TSH  Hyperlipidemia, unspecified hyperlipidemia type - Plan: Comprehensive metabolic panel with GFR, Lipid panel  Vitamin D deficiency, unspecified - Plan: VITAMIN D 25 Hydroxy (Vit-D Deficiency, Fractures)  Elevated blood pressure reading - Plan: CBC Please monitor blood pressure for the next 2 weeks and let me know about readings.  If you need refills for medications you take chronically, please call your pharmacy. Do not use My Chart to request refills or for acute issues that need immediate attention. If you send a my chart message, it may take a few days to be addressed, specially if I am not in the office.  Please be sure medication list is accurate. If a new problem present, please set up appointment sooner than planned today.  Health Maintenance, Female Adopting a healthy lifestyle and getting preventive care are important in promoting health and wellness. Ask your health care provider about: The right schedule for you to have regular tests and exams. Things you can do on your own to prevent diseases and keep yourself healthy. What should I know about diet, weight, and exercise? Eat a healthy diet  Eat a diet that includes plenty of vegetables, fruits, low-fat dairy products, and lean protein. Do not eat a lot of foods that are high in solid fats, added sugars, or sodium. Maintain a healthy weight Body mass index (BMI) is used to identify weight problems. It estimates body fat based on height and weight. Your health care provider can help determine your BMI and help you achieve or maintain a healthy weight. Get regular exercise Get regular exercise. This is one of the most important things you can do for your health. Most adults should: Exercise for at least 150 minutes each week. The exercise should increase your heart rate  and make you sweat (moderate-intensity exercise). Do strengthening exercises at least twice a week. This is in addition to the moderate-intensity exercise. Spend less time sitting. Even light physical activity can be beneficial. Watch cholesterol and blood lipids Have your blood tested for lipids and cholesterol at 66 years of age, then have this test every 5 years. Have your cholesterol levels checked more often if: Your lipid or cholesterol levels are high. You are older than 66 years of age. You are at high risk for heart disease. What should I know about cancer screening? Depending on your health history and family history, you may need to have cancer screening at various ages. This may include screening for: Breast cancer. Cervical cancer. Colorectal cancer. Skin cancer. Lung cancer. What should I know about heart disease, diabetes, and high blood pressure? Blood pressure and heart disease High blood pressure causes heart disease and increases the risk of stroke. This is more likely to develop in people who have high blood pressure readings or are overweight. Have your blood pressure checked: Every 3-5 years if you are 32-77 years of age. Every year if you are 67 years old or older. Diabetes Have regular diabetes screenings. This checks your fasting blood sugar level. Have the screening done: Once every three years after age 65 if you are at a normal weight and have a low risk for diabetes. More often and at a younger age if you are overweight or have a high risk for diabetes. What should I know about preventing infection? Hepatitis B If you have a higher risk for hepatitis  B, you should be screened for this virus. Talk with your health care provider to find out if you are at risk for hepatitis B infection. Hepatitis C Testing is recommended for: Everyone born from [birth year] through 1963. Anyone with known risk factors for hepatitis C. Sexually transmitted infections (STIs) Get  screened for STIs, including gonorrhea and chlamydia, if: You are sexually active and are younger than 66 years of age. You are older than 66 years of age and your health care provider tells you that you are at risk for this type of infection. Your sexual activity has changed since you were last screened, and you are at increased risk for chlamydia or gonorrhea. Ask your health care provider if you are at risk. Ask your health care provider about whether you are at high risk for HIV. Your health care provider may recommend a prescription medicine to help prevent HIV infection. If you choose to take medicine to prevent HIV, you should first get tested for HIV. You should then be tested every 3 months for as long as you are taking the medicine. Pregnancy If you are about to stop having your period (premenopausal) and you may become pregnant, seek counseling before you get pregnant. Take 400 to 800 micrograms (mcg) of folic acid every day if you become pregnant. Ask for birth control (contraception) if you want to prevent pregnancy. Osteoporosis and menopause Osteoporosis is a disease in which the bones lose minerals and strength with aging. This can result in bone fractures. If you are 76 years old or older, or if you are at risk for osteoporosis and fractures, ask your health care provider if you should: Be screened for bone loss. Take a calcium or vitamin D supplement to lower your risk of fractures. Be given hormone replacement therapy (HRT) to treat symptoms of menopause. Follow these instructions at home: Alcohol use Do not drink alcohol if: Your health care provider tells you not to drink. You are pregnant, may be pregnant, or are planning to become pregnant. If you drink alcohol: Limit how much you have to: 0-1 drink a day. Know how much alcohol is in your drink. In the [place] one drink equals one 12 oz bottle of beer (355 mL), one 5 oz glass of wine (148 mL), or one 1 oz glass of hard  liquor (44 mL). Lifestyle Do not use any products that contain nicotine or tobacco. These products include cigarettes, chewing tobacco, and vaping devices, such as e-cigarettes. If you need help quitting, ask your health care provider. Do not use street drugs. Do not share needles. Ask your health care provider for help if you need support or information about quitting drugs. General instructions Schedule regular health, dental, and eye exams. Stay current with your vaccines. Tell your health care provider if: You often feel depressed. You have ever been abused or do not feel safe at home. Summary Adopting a healthy lifestyle and getting preventive care are important in promoting health and wellness. Follow your health care provider's instructions about healthy diet, exercising, and getting tested or screened for diseases. Follow your health care provider's instructions on monitoring your cholesterol and blood pressure. This information is not intended to replace advice given to you by your health care provider. Make sure you discuss any questions you have with your health care provider. Document Revised: 09/14/2020 Document Reviewed: 09/14/2020 Elsevier Patient Education  2024 [employer].

## 2023-08-08 NOTE — Assessment & Plan Note (Addendum)
 Non pharmacologic treatment recommended for now. Further recommendations will be given according to 10 years CVD risk score and lipid panel numbers.

## 2023-08-08 NOTE — Assessment & Plan Note (Signed)
Continue vit D 2000 U daily.  Further recommendations according to 25 OH vit D result.

## 2023-08-14 ENCOUNTER — Other Ambulatory Visit

## 2023-08-14 ENCOUNTER — Other Ambulatory Visit: Payer: Self-pay | Admitting: Emergency Medicine

## 2023-08-14 DIAGNOSIS — Z780 Asymptomatic menopausal state: Secondary | ICD-10-CM

## 2023-08-14 DIAGNOSIS — Z0000 Encounter for general adult medical examination without abnormal findings: Secondary | ICD-10-CM

## 2023-08-14 NOTE — Addendum Note (Signed)
 Addended by: Kathreen Devoid on: 08/14/2023 02:13 PM   Modules accepted: Orders

## 2023-08-15 ENCOUNTER — Ambulatory Visit (INDEPENDENT_AMBULATORY_CARE_PROVIDER_SITE_OTHER)

## 2023-08-15 DIAGNOSIS — Z0000 Encounter for general adult medical examination without abnormal findings: Secondary | ICD-10-CM

## 2023-08-15 LAB — FECAL OCCULT BLOOD, IMMUNOCHEMICAL: Fecal Occult Bld: NEGATIVE

## 2023-08-16 ENCOUNTER — Other Ambulatory Visit (INDEPENDENT_AMBULATORY_CARE_PROVIDER_SITE_OTHER): Payer: Self-pay

## 2023-08-16 ENCOUNTER — Ambulatory Visit: Admitting: Orthopaedic Surgery

## 2023-08-16 ENCOUNTER — Encounter: Payer: Self-pay | Admitting: Family Medicine

## 2023-08-16 DIAGNOSIS — M25561 Pain in right knee: Secondary | ICD-10-CM

## 2023-08-16 DIAGNOSIS — M25572 Pain in left ankle and joints of left foot: Secondary | ICD-10-CM

## 2023-08-16 NOTE — Progress Notes (Addendum)
 Office Visit Note   Patient: Jillian Martinez           Date of Birth: August 07, 1955           MRN: [id number] Visit Date: 08/16/2023              Requested by: [place], Betty G, MD [address] [place],  [place] [address] PCP: [place], Betty G, MD   Assessment & Plan: Visit Diagnoses:  1. Acute pain of right knee   2. Pain in left ankle and joints of left foot     Plan: Patient would like to avoid medication at this time, I do believe patient's foot pain is likely related to degenerative findings with the base of the fifth on x-ray.  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  Discussed with patient usage of meloxicam, patient states that she has severe allergies to Advil.  Did discuss with patient that she may try topical Voltaren if she feels like it is not improving and patient would like to try alternatives at this time.  Did discuss with patient that the best thing to do otherwise would be to wear supportive walking/hiking shoes for her trip.  Patient's knee pain is likely related to possible degenerative meniscus versus arthritis flare.  At this time, patient has no pain and would benefit from stability.  Advised patient to start wearing a knee compression sleeve.  Patient understanding and agreeable with plan.  Follow-Up Instructions: No follow-ups on file.   Orders:  Orders Placed This Encounter  Procedures   XR KNEE 3 VIEW RIGHT   XR Foot Complete Left   No orders of the defined types were placed in this encounter.     Procedures: No procedures performed   Clinical Data: No additional findings.   Subjective: Chief Complaint  Patient presents with   Right Knee - Pain   Left Foot - Pain    Patient is here for right knee pain that started on Monday.  Patient notes that she was moving her harp and felt some pain in her knee afterwards.  Patient states that the pain has gone away but she states she has a trip to [place] on Saturday and plans on doing lots  of walking so she wanted to get checked out.  Patient also notes some pain on her left foot.  The pain is located on the lateral aspect of the midfoot.  Patient does not remember any inciting event for this injury.  Patient states that the pain started last night.    Review of Systems  Constitutional: Negative.   HENT: Negative.    Eyes: Negative.   Respiratory: Negative.    Cardiovascular: Negative.   Endocrine: Negative.   Musculoskeletal: Negative.   Neurological: Negative.   Hematological: Negative.   Psychiatric/Behavioral: Negative.    All other systems reviewed and are negative.    Objective: Vital Signs: LMP 05/09/2012   Physical Exam Vitals and nursing note reviewed.  Constitutional:      Appearance: She is well-developed.  HENT:     Head: Atraumatic.     Nose: Nose normal.  Eyes:     Extraocular Movements: Extraocular movements intact.  Cardiovascular:     Pulses: Normal pulses.  Pulmonary:     Effort: Pulmonary effort is normal.  Abdominal:     Palpations: Abdomen is soft.  Musculoskeletal:     Cervical back: Neck supple.  Skin:    General: Skin is warm.  Capillary Refill: Capillary refill takes less than 2 seconds.  Neurological:     Mental Status: She is alert. Mental status is at baseline.  Psychiatric:        Behavior: Behavior normal.        Thought Content: Thought content normal.        Judgment: Judgment normal.     Ortho Exam Examination of right knee shows no joint effusion.  No joint line tenderness.  1+ patellofemoral crepitus with range of motion.  Collaterals and cruciates are stable.  Normal motor and sensory function.  Examination of the left foot shows slight tenderness to the base of the fifth metatarsal.  Adequate ankle and subtalar motion.  There is no swelling.  There is no tendon subluxation. Specialty Comments:  No specialty comments available.  Imaging: XR Foot Complete Left Result Date: 08/16/2023 X-ray of the foot  shows some degenerative changes at the base of the fifth consistent with some spurring.  No other fractures or dislocations noted.  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    PMFS History: Patient Active Problem List   Diagnosis Date Noted   Hyperlipidemia 03/21/2023   Osteopenia of multiple sites 03/21/2023   Vitamin D deficiency, unspecified 03/21/2023   Hypothyroidism (acquired) 03/21/2023   Past Medical History:  Diagnosis Date   Focal nodular hyperplasia of liver    Left patella fracture 01/08/2020   no surgical care.   Osteopenia 2023   FRAX:  increased fracture risk   PCOS (polycystic ovarian syndrome)    Thyroid disease    hypothyroidism    Family History  Problem Relation Age of Onset   Hypertension Mother    Thyroid disease Mother    Hypertension Father    Heart disease Father    Skin cancer Father    Breast cancer Neg Hx     Past Surgical History:  Procedure Laterality Date   gartners duct cyst Right    -vaginal wall   ROOT CANAL  06/05/14   Tooth Capped    Social History   Occupational History   Not on file  Tobacco Use   Smoking status: Never   Smokeless tobacco: Never  Vaping Use   Vaping status: Never Used  Substance and Sexual Activity   Alcohol use: No   Drug use: No   Sexual activity: Not Currently    Partners: Male    Birth control/protection: Post-menopausal    Comment: less than 5, IC after 16, no STD, no abnormal pap, no DES

## 2023-08-18 ENCOUNTER — Telehealth: Payer: Self-pay

## 2023-08-18 NOTE — Telephone Encounter (Signed)
 Copied from CRM [id number]. Topic: Clinical - Medication Question >> Aug 18, 2023 10:41 AM Sonny Dandy B wrote: Reason for CRM: patient called to report blood pressure for provider. 08/08/23 124/75,  08/09/23 120/73 morning , evening 121/63, 4/3 128/74 morning, evening 127/77 4/4 119/70 morning,evening 106/64 08/12/23 morning 126/78, Evening 129/69 08/13/23 morning 125/71 evening 123/76, 08/14/23 morning 125/71 evening 130/82 , 08/15/23 morning 114/69, evening 105/63. 08/16/23 133/77 morning,evening 132/85 08/17/23 morning 115/63 no evening , 08/1123 morning 117/66

## 2023-08-22 NOTE — Telephone Encounter (Signed)
 In general BP readings are in normal range. No further recommendations needed at this time. BJ

## 2023-09-15 ENCOUNTER — Ambulatory Visit: Admitting: Family Medicine

## 2023-09-21 ENCOUNTER — Other Ambulatory Visit: Payer: Self-pay | Admitting: Obstetrics and Gynecology

## 2023-09-21 DIAGNOSIS — Z780 Asymptomatic menopausal state: Secondary | ICD-10-CM

## 2023-09-21 NOTE — Progress Notes (Signed)
 Order for Dexa at [facility].

## 2023-11-09 ENCOUNTER — Encounter (INDEPENDENT_AMBULATORY_CARE_PROVIDER_SITE_OTHER): Payer: Self-pay

## 2023-12-06 ENCOUNTER — Ambulatory Visit (INDEPENDENT_AMBULATORY_CARE_PROVIDER_SITE_OTHER): Admitting: Otolaryngology

## 2023-12-06 ENCOUNTER — Encounter (INDEPENDENT_AMBULATORY_CARE_PROVIDER_SITE_OTHER): Payer: Self-pay | Admitting: Otolaryngology

## 2023-12-06 VITALS — BP 147/83 | HR 77 | Ht 62.0 in | Wt 155.0 lb

## 2023-12-06 DIAGNOSIS — H906 Mixed conductive and sensorineural hearing loss, bilateral: Secondary | ICD-10-CM

## 2023-12-06 DIAGNOSIS — H6983 Other specified disorders of Eustachian tube, bilateral: Secondary | ICD-10-CM

## 2023-12-07 DIAGNOSIS — H906 Mixed conductive and sensorineural hearing loss, bilateral: Secondary | ICD-10-CM | POA: Insufficient documentation

## 2023-12-07 DIAGNOSIS — H6983 Other specified disorders of Eustachian tube, bilateral: Secondary | ICD-10-CM | POA: Insufficient documentation

## 2023-12-07 NOTE — Progress Notes (Signed)
 CC: Hearing loss, crackling/popping sensation in ears  HPI:  Jillian Martinez is a 68 y.o. female who presents today complaining of hearing loss and crackling/popping sensation in her ears.  Her symptoms started 2 years ago, after she experienced an episode of otitis media.  The infection was successfully treated with antibiotic.  However, she has noted increasing hearing difficulty and frequent popping and crackling sensation in her ears.  She denies any significant environmental allergies.  She was recently evaluated at the [facility] audiology department, and was noted to have bilateral mixed hearing loss.  The patient has no previous otologic surgery.  Currently she denies any otalgia, otorrhea, or vertigo.  Past Medical History:  Diagnosis Date   Focal nodular hyperplasia of liver    Left patella fracture 01/08/2020   no surgical care.   Osteopenia 2023   FRAX:  increased fracture risk   PCOS (polycystic ovarian syndrome)    Thyroid  disease    hypothyroidism    Past Surgical History:  Procedure Laterality Date   gartners duct cyst Right    -vaginal wall   ROOT CANAL  06/05/14   Tooth Capped     Family History  Problem Relation Age of Onset   Hypertension Mother    Thyroid  disease Mother    Hypertension Father    Heart disease Father    Skin cancer Father    Breast cancer Neg Hx     Social History:  reports that she has never smoked. She has never used smokeless tobacco. She reports that she does not drink alcohol and does not use drugs.  Allergies:  Allergies  Allergen Reactions   Benzalkonium Chloride     Other reaction(s): Blisters   Bacitracin-Polymyxin B Other (See Comments)   Other Other (See Comments)   Advil [Ibuprofen] Hives, Rash and Other (See Comments)   Neomycin-Bacitracin Zn-Polymyx Rash    Prior to Admission medications   Medication Sig Start Date End Date Taking? Authorizing Provider  cholecalciferol (VITAMIN D3) 25 MCG (1000 UNIT) tablet Take 2,000  Units by mouth daily.   Yes [provider]  levothyroxine  (SYNTHROID ) 50 MCG tablet Take 1 tablet (50 mcg total) by mouth daily. 08/08/23  Yes [place], Betty G, MD  Multiple Vitamin (MULTIVITAMIN) capsule Take 1 capsule by mouth daily.   Yes [provider]  NON FORMULARY Cleanse More - one tab daily.   Yes [provider]  NON FORMULARY Proactazynie - one tab daily.   Yes [provider]  NON FORMULARY Bifidophilus Flora Force - one tab daily.   Yes [provider]    Blood pressure (!) 147/83, pulse 77, height 5' 2 (1.575 m), weight 155 lb (70.3 kg), last menstrual period 05/09/2012, SpO2 97%. Exam: General: Communicates without difficulty, well nourished, no acute distress. Head: Normocephalic, no evidence injury, no tenderness, facial buttresses intact without stepoff. Face/sinus: No tenderness to palpation and percussion. Facial movement is normal and symmetric. Eyes: PERRL, EOMI. No scleral icterus, conjunctivae clear. Neuro: CN II exam reveals vision grossly intact.  No nystagmus at any point of gaze. Ears: Auricles well formed without lesions.  Ear canals are intact without mass or lesion.  No erythema or edema is appreciated.  The TMs are intact without fluid. Nose: External evaluation reveals normal support and skin without lesions.  Dorsum is intact.  Anterior rhinoscopy reveals congested mucosa over anterior aspect of inferior turbinates and intact septum.  No purulence noted. Oral:  Oral cavity and oropharynx are intact, symmetric,  without erythema or edema.  Mucosa is moist without lesions. Neck: Full range of motion without pain.  There is no significant lymphadenopathy.  No masses palpable.  Thyroid  bed within normal limits to palpation.  Parotid glands and submandibular glands equal bilaterally without mass.  Trachea is midline. Neuro:  CN 2-12 grossly intact.   Assessment: 1.  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 2.  Her ear canals, tympanic membranes, and middle ear spaces are normal.  No middle ear effusion or infection is noted. 3.  Her history is suggestive of bilateral eustachian tube dysfunction.  Plan: 1.  The physical exam findings and the hearing test results are reviewed with the patient. 2.  The patient is reassured that no middle ear effusion or infection is noted today. 3.  The patient is a candidate for hearing amplification. 4.  Flonase nasal spray 2 sprays each nostril daily. 5.  The patient will return for reevaluation in 1 year, sooner if needed.  Nolia Tschantz W Tashira Torre 12/07/2023, 11:22 AM

## 2024-03-11 ENCOUNTER — Encounter: Payer: Self-pay | Admitting: Radiology

## 2024-04-23 ENCOUNTER — Ambulatory Visit: Admitting: Family Medicine

## 2024-06-04 ENCOUNTER — Other Ambulatory Visit: Payer: Self-pay | Admitting: Family Medicine

## 2024-06-04 DIAGNOSIS — Z1231 Encounter for screening mammogram for malignant neoplasm of breast: Secondary | ICD-10-CM

## 2024-07-10 ENCOUNTER — Encounter: Admitting: Family Medicine

## 2024-07-17 ENCOUNTER — Ambulatory Visit

## 2024-07-22 ENCOUNTER — Ambulatory Visit
